# Patient Record
Sex: Male | Born: 1945 | Race: White | Hispanic: No | Marital: Married | Smoking: Never smoker
Health system: Southern US, Community
[De-identification: ages and names within clinical notes are randomized; demographics above are authoritative.]

## PROBLEM LIST (undated history)

## (undated) DIAGNOSIS — R739 Hyperglycemia, unspecified: Secondary | ICD-10-CM

## (undated) DIAGNOSIS — N400 Enlarged prostate without lower urinary tract symptoms: Secondary | ICD-10-CM

## (undated) DIAGNOSIS — N200 Calculus of kidney: Secondary | ICD-10-CM

## (undated) DIAGNOSIS — I4510 Unspecified right bundle-branch block: Secondary | ICD-10-CM

## (undated) DIAGNOSIS — I809 Phlebitis and thrombophlebitis of unspecified site: Secondary | ICD-10-CM

## (undated) DIAGNOSIS — I8390 Asymptomatic varicose veins of unspecified lower extremity: Secondary | ICD-10-CM

## (undated) DIAGNOSIS — E785 Hyperlipidemia, unspecified: Secondary | ICD-10-CM

## (undated) DIAGNOSIS — E119 Type 2 diabetes mellitus without complications: Secondary | ICD-10-CM

## (undated) DIAGNOSIS — I10 Essential (primary) hypertension: Secondary | ICD-10-CM

## (undated) DIAGNOSIS — R0989 Other specified symptoms and signs involving the circulatory and respiratory systems: Secondary | ICD-10-CM

## (undated) DIAGNOSIS — G459 Transient cerebral ischemic attack, unspecified: Secondary | ICD-10-CM

## (undated) HISTORY — PX: NASAL SEPTUM SURGERY: SHX37

## (undated) HISTORY — DX: Calculus of kidney: N20.0

## (undated) HISTORY — DX: Asymptomatic varicose veins of unspecified lower extremity: I83.90

## (undated) HISTORY — DX: Essential (primary) hypertension: I10

## (undated) HISTORY — PX: COLONOSCOPY: SHX174

## (undated) HISTORY — PX: TONSILLECTOMY: SUR1361

## (undated) HISTORY — DX: Benign prostatic hyperplasia without lower urinary tract symptoms: N40.0

## (undated) HISTORY — DX: Hyperglycemia, unspecified: R73.9

## (undated) HISTORY — DX: Unspecified right bundle-branch block: I45.10

## (undated) HISTORY — DX: Gilbert syndrome: E80.4

## (undated) HISTORY — PX: OTHER SURGICAL HISTORY: SHX169

## (undated) HISTORY — DX: Hyperlipidemia, unspecified: E78.5

## (undated) HISTORY — DX: Other specified symptoms and signs involving the circulatory and respiratory systems: R09.89

## (undated) HISTORY — PX: CYSTOSCOPY: SUR368

## (undated) HISTORY — DX: Type 2 diabetes mellitus without complications: E11.9

## (undated) HISTORY — DX: Transient cerebral ischemic attack, unspecified: G45.9

## (undated) HISTORY — DX: Phlebitis and thrombophlebitis of unspecified site: I80.9

---

## 2000-08-15 ENCOUNTER — Other Ambulatory Visit: Admission: RE | Admit: 2000-08-15 | Discharge: 2000-08-15 | Payer: Self-pay | Admitting: Urology

## 2002-11-18 ENCOUNTER — Ambulatory Visit (HOSPITAL_COMMUNITY): Admission: RE | Admit: 2002-11-18 | Discharge: 2002-11-18 | Payer: Self-pay | Admitting: Internal Medicine

## 2002-11-18 ENCOUNTER — Encounter: Payer: Self-pay | Admitting: Internal Medicine

## 2004-09-16 DIAGNOSIS — I809 Phlebitis and thrombophlebitis of unspecified site: Secondary | ICD-10-CM

## 2004-09-16 HISTORY — DX: Phlebitis and thrombophlebitis of unspecified site: I80.9

## 2004-09-29 ENCOUNTER — Inpatient Hospital Stay (HOSPITAL_COMMUNITY): Admission: AD | Admit: 2004-09-29 | Discharge: 2004-10-03 | Payer: Self-pay | Admitting: Internal Medicine

## 2004-09-29 ENCOUNTER — Ambulatory Visit: Payer: Self-pay | Admitting: Internal Medicine

## 2005-02-01 ENCOUNTER — Ambulatory Visit: Payer: Self-pay | Admitting: Internal Medicine

## 2005-02-15 ENCOUNTER — Ambulatory Visit: Payer: Self-pay | Admitting: Internal Medicine

## 2005-02-22 ENCOUNTER — Ambulatory Visit: Payer: Self-pay | Admitting: Internal Medicine

## 2005-04-12 ENCOUNTER — Ambulatory Visit: Payer: Self-pay | Admitting: Internal Medicine

## 2005-05-28 ENCOUNTER — Ambulatory Visit: Payer: Self-pay | Admitting: Internal Medicine

## 2005-06-21 ENCOUNTER — Ambulatory Visit: Payer: Self-pay | Admitting: Internal Medicine

## 2005-09-18 ENCOUNTER — Ambulatory Visit: Payer: Self-pay | Admitting: Internal Medicine

## 2005-10-29 ENCOUNTER — Ambulatory Visit: Payer: Self-pay | Admitting: Internal Medicine

## 2005-11-22 ENCOUNTER — Ambulatory Visit: Payer: Self-pay | Admitting: Internal Medicine

## 2005-12-14 ENCOUNTER — Ambulatory Visit: Payer: Self-pay | Admitting: Internal Medicine

## 2006-03-12 ENCOUNTER — Ambulatory Visit: Payer: Self-pay | Admitting: Internal Medicine

## 2006-03-18 ENCOUNTER — Ambulatory Visit: Payer: Self-pay | Admitting: Internal Medicine

## 2006-08-22 ENCOUNTER — Ambulatory Visit: Payer: Self-pay | Admitting: Internal Medicine

## 2006-08-29 ENCOUNTER — Ambulatory Visit: Payer: Self-pay | Admitting: Internal Medicine

## 2007-02-10 ENCOUNTER — Ambulatory Visit: Payer: Self-pay | Admitting: Internal Medicine

## 2007-02-10 LAB — CONVERTED CEMR LAB
ALT: 19 units/L (ref 0–40)
AST: 21 units/L (ref 0–37)
BUN: 18 mg/dL (ref 6–23)
Cholesterol: 156 mg/dL (ref 0–200)
Creatinine, Ser: 0.9 mg/dL (ref 0.4–1.5)
Creatinine,U: 55.8 mg/dL
HDL: 50.5 mg/dL (ref >39.0)
Hgb A1c MFr Bld: 5.9 % (ref 4.6–6.0)
LDL Cholesterol: 97 mg/dL (ref 0–99)
Microalb Creat Ratio: 3.6 mg/g (ref 0.0–30.0)
Microalb, Ur: 0.2 mg/dL (ref 0.0–1.9)
Potassium: 5 meq/L (ref 3.5–5.1)
Total CHOL/HDL Ratio: 3.1
Triglycerides: 41 mg/dL (ref 0–149)
VLDL: 8 mg/dL (ref 0–40)

## 2007-06-05 ENCOUNTER — Telehealth (INDEPENDENT_AMBULATORY_CARE_PROVIDER_SITE_OTHER): Payer: Self-pay | Admitting: *Deleted

## 2007-07-29 ENCOUNTER — Ambulatory Visit: Payer: Self-pay | Admitting: Internal Medicine

## 2007-08-08 ENCOUNTER — Encounter (INDEPENDENT_AMBULATORY_CARE_PROVIDER_SITE_OTHER): Payer: Self-pay | Admitting: *Deleted

## 2007-08-08 ENCOUNTER — Telehealth (INDEPENDENT_AMBULATORY_CARE_PROVIDER_SITE_OTHER): Payer: Self-pay | Admitting: *Deleted

## 2007-09-16 ENCOUNTER — Encounter: Payer: Self-pay | Admitting: Internal Medicine

## 2008-02-13 ENCOUNTER — Ambulatory Visit: Payer: Self-pay | Admitting: Internal Medicine

## 2008-02-13 DIAGNOSIS — I4510 Unspecified right bundle-branch block: Secondary | ICD-10-CM | POA: Insufficient documentation

## 2008-02-13 DIAGNOSIS — E1169 Type 2 diabetes mellitus with other specified complication: Secondary | ICD-10-CM

## 2008-02-13 DIAGNOSIS — E785 Hyperlipidemia, unspecified: Secondary | ICD-10-CM

## 2008-02-13 DIAGNOSIS — N400 Enlarged prostate without lower urinary tract symptoms: Secondary | ICD-10-CM | POA: Insufficient documentation

## 2008-02-13 DIAGNOSIS — I10 Essential (primary) hypertension: Secondary | ICD-10-CM

## 2008-02-17 ENCOUNTER — Encounter (INDEPENDENT_AMBULATORY_CARE_PROVIDER_SITE_OTHER): Payer: Self-pay | Admitting: *Deleted

## 2008-03-22 ENCOUNTER — Encounter (INDEPENDENT_AMBULATORY_CARE_PROVIDER_SITE_OTHER): Payer: Self-pay | Admitting: *Deleted

## 2008-03-22 ENCOUNTER — Ambulatory Visit: Payer: Self-pay | Admitting: Internal Medicine

## 2008-03-22 LAB — CONVERTED CEMR LAB
OCCULT 1: NEGATIVE
OCCULT 2: NEGATIVE
OCCULT 3: NEGATIVE

## 2008-07-19 ENCOUNTER — Ambulatory Visit: Payer: Self-pay | Admitting: Internal Medicine

## 2008-08-01 LAB — CONVERTED CEMR LAB
Cholesterol: 148 mg/dL (ref 0–200)
HDL: 40 mg/dL (ref >39.0)
LDL Cholesterol: 95 mg/dL (ref 0–99)
Total CHOL/HDL Ratio: 3.7
Triglycerides: 64 mg/dL (ref 0–149)
VLDL: 13 mg/dL (ref 0–40)

## 2008-08-02 ENCOUNTER — Encounter (INDEPENDENT_AMBULATORY_CARE_PROVIDER_SITE_OTHER): Payer: Self-pay | Admitting: *Deleted

## 2008-09-16 ENCOUNTER — Encounter: Payer: Self-pay | Admitting: Internal Medicine

## 2009-01-25 ENCOUNTER — Ambulatory Visit: Payer: Self-pay | Admitting: Internal Medicine

## 2009-01-25 DIAGNOSIS — E1151 Type 2 diabetes mellitus with diabetic peripheral angiopathy without gangrene: Secondary | ICD-10-CM

## 2009-07-25 ENCOUNTER — Ambulatory Visit: Payer: Self-pay | Admitting: Internal Medicine

## 2009-07-25 ENCOUNTER — Encounter (INDEPENDENT_AMBULATORY_CARE_PROVIDER_SITE_OTHER): Payer: Self-pay | Admitting: *Deleted

## 2009-07-25 LAB — CONVERTED CEMR LAB
ALT: 26 units/L (ref 0–53)
AST: 26 units/L (ref 0–37)
Albumin: 4.1 g/dL (ref 3.5–5.2)
Alkaline Phosphatase: 53 units/L (ref 39–117)
BUN: 22 mg/dL (ref 6–23)
Bilirubin, Direct: 0.2 mg/dL (ref 0.0–0.3)
CO2: 31 meq/L (ref 19–32)
Calcium: 9.3 mg/dL (ref 8.4–10.5)
Chloride: 108 meq/L (ref 96–112)
Cholesterol: 183 mg/dL (ref 0–200)
Creatinine, Ser: 0.9 mg/dL (ref 0.4–1.5)
Creatinine,U: 220.6 mg/dL
GFR calc non Af Amer: 89.89 mL/min (ref >60)
Glucose, Bld: 102 mg/dL — ABNORMAL HIGH (ref 70–99)
HDL: 48.4 mg/dL (ref >39.00)
Hgb A1c MFr Bld: 6 % (ref 4.6–6.5)
LDL Cholesterol: 119 mg/dL — ABNORMAL HIGH (ref 0–99)
Microalb Creat Ratio: 1.4 mg/g (ref 0.0–30.0)
Microalb, Ur: 0.3 mg/dL (ref 0.0–1.9)
Potassium: 4.5 meq/L (ref 3.5–5.1)
Sodium: 144 meq/L (ref 135–145)
Total Bilirubin: 1.2 mg/dL (ref 0.3–1.2)
Total CHOL/HDL Ratio: 4
Total Protein: 7 g/dL (ref 6.0–8.3)
Triglycerides: 80 mg/dL (ref 0.0–149.0)
VLDL: 16 mg/dL (ref 0.0–40.0)

## 2009-10-13 ENCOUNTER — Ambulatory Visit: Payer: Self-pay | Admitting: Internal Medicine

## 2010-01-30 ENCOUNTER — Encounter: Payer: Self-pay | Admitting: Internal Medicine

## 2010-04-17 ENCOUNTER — Ambulatory Visit: Payer: Self-pay | Admitting: Internal Medicine

## 2010-04-17 LAB — CONVERTED CEMR LAB
ALT: 19 units/L (ref 0–53)
AST: 19 units/L (ref 0–37)
Albumin: 4.2 g/dL (ref 3.5–5.2)
Alkaline Phosphatase: 43 units/L (ref 39–117)
BUN: 18 mg/dL (ref 6–23)
Bilirubin, Direct: 0.1 mg/dL (ref 0.0–0.3)
Cholesterol: 156 mg/dL (ref 0–200)
Creatinine, Ser: 1 mg/dL (ref 0.4–1.5)
HDL: 49.8 mg/dL (ref >39.00)
LDL Cholesterol: 94 mg/dL (ref 0–99)
Potassium: 4.9 meq/L (ref 3.5–5.1)
Total Bilirubin: 0.7 mg/dL (ref 0.3–1.2)
Total CHOL/HDL Ratio: 3
Total Protein: 6.6 g/dL (ref 6.0–8.3)
Triglycerides: 62 mg/dL (ref 0.0–149.0)
VLDL: 12.4 mg/dL (ref 0.0–40.0)

## 2010-04-26 ENCOUNTER — Encounter: Payer: Self-pay | Admitting: Internal Medicine

## 2010-04-27 ENCOUNTER — Ambulatory Visit: Payer: Self-pay | Admitting: Internal Medicine

## 2010-04-27 DIAGNOSIS — J302 Other seasonal allergic rhinitis: Secondary | ICD-10-CM

## 2010-04-27 DIAGNOSIS — Z87442 Personal history of urinary calculi: Secondary | ICD-10-CM | POA: Insufficient documentation

## 2010-10-10 ENCOUNTER — Ambulatory Visit: Payer: Self-pay | Admitting: Internal Medicine

## 2010-10-27 ENCOUNTER — Encounter: Payer: Self-pay | Admitting: Internal Medicine

## 2010-12-22 ENCOUNTER — Encounter: Payer: Self-pay | Admitting: Internal Medicine

## 2011-01-14 LAB — CONVERTED CEMR LAB
ALT: 22 units/L (ref 0–53)
AST: 20 units/L (ref 0–37)
BUN: 16 mg/dL (ref 6–23)
Cholesterol, target level: 200 mg/dL
Cholesterol: 175 mg/dL (ref 0–200)
Creatinine, Ser: 0.9 mg/dL (ref 0.4–1.5)
Creatinine,U: 132.9 mg/dL
HDL goal, serum: 40 mg/dL
HDL: 46 mg/dL (ref >39.0)
Hgb A1c MFr Bld: 5.7 % (ref 4.6–6.0)
LDL Cholesterol: 114 mg/dL — ABNORMAL HIGH (ref 0–99)
LDL Goal: 130 mg/dL
Microalb Creat Ratio: 4.5 mg/g (ref 0.0–30.0)
Microalb, Ur: 0.6 mg/dL (ref 0.0–1.9)
Potassium: 4.1 meq/L (ref 3.5–5.1)
Total CHOL/HDL Ratio: 3.8
Triglycerides: 77 mg/dL (ref 0–149)
VLDL: 15 mg/dL (ref 0–40)

## 2011-01-18 NOTE — Letter (Signed)
Summary: [facility]   Imported By: Lanelle Bal 05/03/2010 09:46:49  _____________________________________________________________________  External Attachment:    Type:   Image     Comment:   External Document

## 2011-01-18 NOTE — Letter (Signed)
Summary: [facility]  [facility]   Imported By: Lanelle Bal 11/08/2010 13:49:55  _____________________________________________________________________  External Attachment:    Type:   Image     Comment:   External Document

## 2011-01-18 NOTE — Assessment & Plan Note (Signed)
Summary: COUGH AND COLD//PH   Vital Signs:  Patient profile:   65 year old male Weight:      184.8 pounds BMI:     26.42 Temp:     98.3 degrees F 0 Pulse rate:   88 / minute Resp:     16 per minute BP sitting:   138 / 90  (left arm) Cuff size:   large  Vitals Entered By: Shonna Chock CMA (October 10, 2010 10:31 AM) CC: Cold and cough , Cough   CC:  Cold and cough  and Cough.  History of Present Illness: Cough      This is a 65 year old man who presents with Cough X 10 days.  The patient reports productive cough with yellow sputum , but denies pleuritic chest pain, shortness of breath, wheezing, exertional dyspnea, fever, hemoptysis, and malaise.  Associated symtpoms include sore throat from the cough.  The patient denies the following symptoms: cold/URI symptoms and nasal congestion.  The cough is worse with cold exposure.  Ineffective prior treatments have included OTC cough medication.  Rx: Mucinex, Zinc drops, "rock & rye", Robitussin with marginal response.  Current Medications (verified): 1)  Benazepril Hcl 40 Mg  Tabs (Benazepril Hcl) .Marland Kitchen.. 1 By Mouth Qd 2)  Baby Asa 3)  Vit E 4)  Freestyle Lite Test  Strp (Glucose Blood) 5)  Freestyle Lancets  Misc (Lancets) 6)  Fish Oil 1000 Mg Caps (Omega-3 Fatty Acids) .Marland Kitchen.. 1 By Mouth Once Daily 7)  Simvastatin 40 Mg Tabs (Simvastatin) .Marland Kitchen.. 1 At Bedtime 8)  Mucinex Dm 30-600 Mg Xr12h-Tab (Dextromethorphan-Guaifenesin) .... As Needed  Allergies (verified): No Known Drug Allergies  Physical Exam  General:  well-nourished,in no acute distress; alert,appropriate and cooperative throughout examination Ears:  External ear exam shows no significant lesions or deformities.  Otoscopic examination reveals clear canals, tympanic membranes are intact bilaterally without bulging, retraction, inflammation or discharge. Hearing is grossly normal bilaterally. R TM dull Nose:  External nasal examination shows no deformity or inflammation. Nasal  mucosa are pink and moist without lesions or exudates. Mouth:  Oral mucosa and oropharynx without lesions or exudates.  Teeth in good repair. Hoarse Lungs:  Normal respiratory effort, chest expands symmetrically. Lungs are clear to auscultation, no crackles or wheezes.Dry cough Heart:  Normal rate and regular rhythm. S1 and S2 normal without gallop, murmur, click, rub .S4  Cervical Nodes:  No lymphadenopathy noted Axillary Nodes:  No palpable lymphadenopathy   Impression & Recommendations:  Problem # 1:  BRONCHITIS-ACUTE (ICD-466.0)  His updated medication list for this problem includes:    Mucinex Dm 30-600 Mg Xr12h-tab (Dextromethorphan-guaifenesin) .Marland Kitchen... As needed    Azithromycin 250 Mg Tabs (Azithromycin) .Marland Kitchen... As per pack    Hydromet 5-1.5 Mg/24ml Syrp (Hydrocodone-homatropine) .Marland Kitchen... 1 tsp every 6 hrs as needed  Complete Medication List: 1)  Benazepril Hcl 40 Mg Tabs (Benazepril hcl) .Marland Kitchen.. 1 by mouth qd 2)  Baby Asa  3)  Vit E  4)  Freestyle Lite Test Strp (Glucose blood) 5)  Freestyle Lancets Misc (Lancets) 6)  Fish Oil 1000 Mg Caps (Omega-3 fatty acids) .Marland Kitchen.. 1 by mouth once daily 7)  Simvastatin 40 Mg Tabs (Simvastatin) .Marland Kitchen.. 1 at bedtime 8)  Mucinex Dm 30-600 Mg Xr12h-tab (Dextromethorphan-guaifenesin) .... As needed 9)  Azithromycin 250 Mg Tabs (Azithromycin) .... As per pack 10)  Hydromet 5-1.5 Mg/13ml Syrp (Hydrocodone-homatropine) .Marland Kitchen.. 1 tsp every 6 hrs as needed  Patient Instructions: 1)  Drink as much  NON dairy  fluid as you can tolerate for the next few days. Prescriptions: HYDROMET 5-1.5 MG/5ML SYRP (HYDROCODONE-HOMATROPINE) 1 tsp every 6 hrs as needed  #120cc x 0   Entered and Authorized by:   Marga Melnick MD   Signed by:   Marga Melnick MD on 10/10/2010   Method used:   Print then Give to Patient   RxID:   [id number] AZITHROMYCIN 250 MG TABS (AZITHROMYCIN) as per pack  #1 x 0   Entered and Authorized by:   Marga Melnick MD   Signed by:   Marga Melnick  MD on 10/10/2010   Method used:   Print then Give to Patient   RxID:   [id number]    Orders Added: 1)  Est. Patient Level III [84696]

## 2011-01-18 NOTE — Consult Note (Signed)
Summary: [facility]   Imported By: Lanelle Bal 01/04/2011 11:56:01  _____________________________________________________________________  External Attachment:    Type:   Image     Comment:   External Document

## 2011-01-18 NOTE — Letter (Signed)
Summary: [facility]  [facility]   Imported By: Lanelle Bal 02/02/2010 13:54:21  _____________________________________________________________________  External Attachment:    Type:   Image     Comment:   External Document

## 2011-01-18 NOTE — Assessment & Plan Note (Signed)
Summary: FOR MR5--PH   Vital Signs:  Patient profile:   65 year old male Pulse rate:   80 / minute Resp:     15 per minute BP sitting:   122 / 80  History of Present Illness: Christopher Bradshaw is here for a med refill & Medicare Wellness Exam; he is having active seasonal  allergic rhinocojunctivitis symptoms. Rx: OTC Claritin with temporary benefit.Risk factors addressed; see ROS.He is unsure as to tetanus or pneumonia vaccine status. FBS 112-120; no hypoglycemia. Weight stable. No retinopathy as per DR Lahoma Crocker 01/30/2010.   Allergies: No Known Drug Allergies  Past History:  Past Medical History: Hyperlipidemia Hypertension femoral bruits varicose veins right lower extremity Benign prostatic hypertrophy,Dr Kimbrough RBBB Hyperglycemia Allergic rhinitis Nephrolithiasis, hx of  Past Surgical History: recurrent renal calculi,lithotripsy X 1, residual L renal calculus , spontaneous passage 10-12 X cystoscopy for  hematuria, urethritis 2004, Dr Aldean Ast thrombophlebitis 09/2004 colonoscopy negative  2000 & 2007 (due 2017) Tonsillectomy  Family History: father lung cancer, Alzheimers died @  27 Mom - 20,  A &W paternal aunt: cancer colon?; P aunt: ovarian cancer paternal grandfather :emphysema, colon cancer  Social History: Never Smoked Married Alcohol use-yes: socially Occupation:[employer], part time  Regular exercise-yes:walks once daily 60  min w/o symptoms  Review of Systems General:  Denies chills, fatigue, fever, loss of appetite, sweats, and weight loss. Eyes:  Denies blurring, double vision, and vision loss-both eyes. ENT:  Complains of nasal congestion and sinus pressure; denies difficulty swallowing and hoarseness. CV:  Denies chest pain or discomfort, leg cramps with exertion, lightheadness, near fainting, shortness of breath with exertion, swelling of feet, and swelling of hands. Resp:  Denies cough and sputum productive. GI:  Denies abdominal pain,  bloody stools, dark tarry stools, and indigestion. GU:  Denies discharge, dysuria, and hematuria; PSA was 1.59 & DRE WNL 04/21/2010. MS:  Denies joint pain, low back pain, mid back pain, and thoracic pain. Derm:  Denies changes in nail beds, dryness, hair loss, lesion(s), and poor wound healing. Neuro:  Denies brief paralysis, difficulty with concentration, disturbances in coordination, memory loss, numbness, poor balance, tingling, and weakness. Psych:  Denies anxiety, depression, easily angered, easily tearful, and irritability. Endo:  Denies cold intolerance, excessive hunger, excessive thirst, excessive urination, and heat intolerance. Heme:  Denies abnormal bruising and bleeding. Allergy:  Complains of itching eyes, seasonal allergies, and sneezing; denies hives or rash; No angioedema.  Physical Exam  General:  well-nourished,in no acute distress; alert,appropriate and cooperative throughout examination Head:  Normocephalic and atraumatic without obvious abnormalities. No apparent alopecia  Eyes:  No corneal or conjunctival inflammation noted. EOMI. Perrla. Funduscopic exam benign, without hemorrhages, exudates or papilledema. Vision grossly normal. Ears:  External ear exam shows no significant lesions or deformities.  Otoscopic examination reveals clear canals, tympanic membranes are intact bilaterally without bulging, retraction, inflammation or discharge. Hearing is grossly normal bilaterally. Nose:  External nasal examination shows no deformity or inflammation. Nasal mucosa are pink and moist without lesions or exudates. Septum to R; obvious nasal congestion Mouth:  Oral mucosa and oropharynx without lesions or exudates.  Teeth in good repair. Neck:  No deformities, masses, or tenderness noted. Lungs:  Normal respiratory effort, chest expands symmetrically. Lungs are clear to auscultation, no crackles or wheezes. Heart:  Normal rate and regular rhythm. S1  normal ; S2 split physiologic  without gallop, murmur, click, rub or other extra sounds. Abdomen:  Bowel sounds positive,abdomen soft and non-tender without masses, organomegaly or  hernias noted. Genitalia:  Dr Aldean Ast Msk:  No deformity or scoliosis noted of thoracic or lumbar spine.   Pulses:  R and L carotid,radial,dorsalis pedis and posterior tibial pulses are full and equal bilaterally Extremities:  No clubbing, cyanosis, edema, or deformity noted with normal full range of motion of all joints.  Insignificant DIP changes . Good nail health Neurologic:  alert & oriented X3, strength normal in all extremities, sensation intact to light touch, gait normal, and DTRs symmetrical and normal.   Skin:  Intact without suspicious lesions or rashes Cervical Nodes:  No lymphadenopathy noted Axillary Nodes:  No palpable lymphadenopathy Psych:  Oriented X3, memory intact for recent and remote, normally interactive, good eye contact, not anxious appearing, and not depressed appearing.     Impression & Recommendations:  Problem # 1:  PREVENTIVE HEALTH CARE (ICD-V70.0)  Orders: EKG w/ Interpretation (93000)  Problem # 2:  ALLERGIC RHINITIS (ICD-477.9)  Problem # 3:  FASTING HYPERGLYCEMIA (ICD-790.29)  Problem # 4:  HYPERTENSION, ESSENTIAL NOS (ICD-401.9)  controlled His updated medication list for this problem includes:    Benazepril Hcl 40 Mg Tabs (Benazepril hcl) .Marland Kitchen... 1 by mouth qd  Orders: EKG w/ Interpretation (93000)  Problem # 5:  HYPERLIPIDEMIA (ICD-272.2)  His updated medication list for this problem includes:    Simvastatin 40 Mg Tabs (Simvastatin) .Marland Kitchen... 1 at bedtime  Problem # 6:  BENIGN PROSTATIC HYPERTROPHY (ICD-600.00) as per Dr Aldean Ast  Problem # 7:  RIGHT BUNDLE BRANCH BLOCK (ICD-426.4)  Orders: EKG w/ Interpretation (93000)  Complete Medication List: 1)  Benazepril Hcl 40 Mg Tabs (Benazepril hcl) .Marland Kitchen.. 1 by mouth qd 2)  Baby Asa  3)  Vit E  4)  Freestyle Lite Test Strp (Glucose blood) 5)   Freestyle Lancets Misc (Lancets) 6)  Fish Oil 1000 Mg Caps (Omega-3 fatty acids) .Marland Kitchen.. 1 by mouth once daily 7)  Simvastatin 40 Mg Tabs (Simvastatin) .Marland Kitchen.. 1 at bedtime  Patient Instructions: 1)  It is important that you exercise regularly at least 20 minutes 5 times a week. If you develop chest pain, have severe difficulty breathing, or feel very tired , stop exercising immediately and seek medical attention.Take an Aspirin every day.Choose your Health care Power of Attorney and/or prepare a Living Will.Check your blood sugars regularly. If your readings are usually above : 150 or below 90 you should contact our office.See your eye doctor yearly to check for diabetic eye damage.Check your feet each night for sore areas, calluses or signs of infection.Check your Blood Pressure regularly. If it is above: 135/85 ON AVERAGE you should make an appointment. Assess response to Singulair 10 mg once daily & Neti Rinse once daily for allergic symptoms.It is important that your Diabetic A1c level is checked every 12  months. Prescriptions: SIMVASTATIN 40 MG TABS (SIMVASTATIN) 1 at bedtime  #90 x 3   Entered and Authorized by:   Marga Melnick MD   Signed by:   Marga Melnick MD on 04/27/2010   Method used:   Print then Give to Patient   RxID:   [id number] BENAZEPRIL HCL 40 MG  TABS (BENAZEPRIL HCL) 1 by mouth qd  #90 x 3   Entered and Authorized by:   Marga Melnick MD   Signed by:   Marga Melnick MD on 04/27/2010   Method used:   Print then Give to Patient   RxID:   [id number]

## 2011-05-04 NOTE — Discharge Summary (Signed)
NAMESHAMMOND, ARAVE                 ACCOUNT NO.:  [id number]   MEDICAL RECORD NO.:  [id number]          PATIENT TYPE:  INP   LOCATION:  0471                         [facility]ACILITY[facility]  [facility]   PHYSICIAN:  Rene Paci, M.D. LHCDATE OF BIRTH:  Apr 30, 1946   DATE OF ADMISSION:  09/29/2004  DATE OF DISCHARGE:  10/03/2004                                 DISCHARGE SUMMARY   DISCHARGE DIAGNOSES:  1.  Right lower extremity thrombophlebitis with completely thrombosed      greater saphenous vein.  Negative deep venous thrombosis by lower      extremity Doppler.  Improved with p.o. antibiotics.  Outpatient follow-      up with vein specialist.  See below.  2.  History of hypertension.  3.  History of hypercholesterolemia.  4.  History of peripheral vascular disease.   DISCHARGE MEDICATIONS:  1.  Avelox 400 mg p.o. daily x10 additional days to complete two week      course.  2.  Maxzide 75/50 one p.o. daily.  3.  Aspirin 81 mg p.o. daily.  4.  Lipitor 20 mg p.o. q.h.s.  5.  Multivitamin one p.o. daily.   FOLLOWUP:  [facility], Dr. Molli Hazard at telephone number  [phone].  Scheduled for Wednesday, October 26 at 9:30 a.m., though their  office will contact patient if availability opens prior to this date.  Patient will continue p.o. antibiotics until this time.   CONDITION ON DISCHARGE:  Medically stable and improved.  Hospital follow-up  as listed above and with primary care physician, Dr. Marga Melnick, as  needed.   HOSPITAL COURSE:  #1 - LOWER EXTREMITY THROMBOPHLEBITIS:  Patient is a  pleasant 65 year old gentleman who has experienced increased swelling, pain,  and redness in his right lower extremity over a week prior to admission.  He  presented to his primary care physician for further evaluation and was  admitted to the hospital for rule out a DVT.  Because of scheduling  conflicts, Doppler was not performed until October 17.  This showed no deep  venous  thrombosis, but a completely thrombosed greater saphenous vein on the  right side.  Patient's subsequent thrombophlebitis with surrounding  cellulitis improved over this time initially with nafcillin, then ampicillin  changed to p.o. Avelox all of which he has tolerated well with clinical  improvement.  He has been afebrile.  At this time he was stable for  discharge to continue p.o. antibiotics and outpatient follow-up for his  thrombosis and thrombophlebitis with [facility] as described  above.   #2 - OTHER MEDICAL ISSUES:  Patient's hypertension and hyperlipidemia were  controlled with his home medications and were not of significant medical  importance during this admission.    [place]  VL/MEDQ  D:  10/03/2004  T:  10/03/2004  Job:  [id number]

## 2011-05-04 NOTE — H&P (Signed)
Christopher Bradshaw, Christopher Bradshaw                 ACCOUNT NO.:  [id number]   MEDICAL RECORD NO.:  [id number]          PATIENT TYPE:  INP   LOCATION:  0471                         FACILITY:  [facility]   PHYSICIAN:  Titus Dubin. Alwyn Ren, M.D. [facility] OF BIRTH:  1946-02-14   DATE OF ADMISSION:  09/29/2004  DATE OF DISCHARGE:                                HISTORY & PHYSICAL   HISTORY OF PRESENT ILLNESS:  Christopher Bradshaw is a 65 year old white male  admitted with thrombophlebitis with cellulitis in the right lower extremity.   Over the weekend of October 7/September 23, 2004, he was inactive and  essentially house bound because of the rain.  As of September 26, 2004, he  noted some redness in the right lower extremity with progression more  markedly September 28, 2004.  Although he had no pain while walking, he would  have pain simply sitting.   He denies any chest pain or shortness of breath.   He states that approximately 40 years ago he was checked for clots in the  leg with apparent ultrasound, which was negative.   PAST SURGICAL HISTORY:  1.  Renal calculi, recurrent since 1995.  He has had lithotripsy, and is      followed by Dr. Vic Blackbird.  2.  He has also had tonsillectomy.  3.  Septoplasty.  4.  Colonoscopy in 2000 was negative.  5.  He has had cystoscopy for hematuria with complications of urethritis.   MEDICAL PROBLEMS:  1.  Hypertension.  2.  Hyperlipidemia.  3.  Peripheral vascular disease with femoral bruits and varicose veins in      the right lower extremity.  4.  He has also had prostatic hypertrophy.  5.  Stones have been calcium oxalate in nature.   FAMILY HISTORY:  Lung cancer, Alzheimer's, and renal calculi in his father.  Paternal aunt had cancer, possibly of the colon.  Paternal grandfather had  emphysema and colon cancer.   SOCIAL HISTORY:  He has never smoked.  He does not drink.   ALLERGIES:  He had palpitations while on ZOCOR.   CURRENT MEDICATIONS:  1.  Multivitamin  supplements.  2.  Baby aspirin.  3.  Triamterene/hydrochlorothiazide 75/50 one daily.  4.  Lipitor 20.   REVIEW OF SYSTEMS:  Otherwise negative with no GI or genitourinary  symptomatology.   PHYSICAL EXAMINATION:  VITAL SIGNS:  Weight is 192, temperature 98, pulse  64, respiratory rate 18, blood pressure 132/98.  HEENT:  Fundal exam reveals some arteriolar narrowing.  Otolaryngologic exam  is unremarkable.  LYMPHATIC:  He has no lymphadenopathy about the head, neck, or axilla.  NECK:  Thyroid is slightly irregular in contour, but there are no definite  nodules.  __________ with slurring.  CHEST:  Clear with no increased work of breathing or splinting.  ABDOMEN:  He has no organomegaly or masses, and there was no  lymphadenopathy.  EXTREMITIES:  There was erythema diffusely over the right medial calf with  large varicose veins which were tender to palpation.  Homans sign was  negative bilaterally.  GENITOURINARY:  Initially deferred.  He does have a card mailed from Dr.  Valda Lamb office stating that his PSA was 1.13 on a recent check.   PLAN:  He will be admitted with Lovenox coverage and ultrasound evaluation.  He will be placed on nafcillin IV because of the thrombophlebitis with high  risk for deep vein thrombosis.      [facility]/MEDQ  D:  09/29/2004  T:  09/29/2004  Job:  [id number]

## 2011-05-31 ENCOUNTER — Other Ambulatory Visit: Payer: Self-pay | Admitting: Internal Medicine

## 2011-07-19 ENCOUNTER — Encounter: Payer: Self-pay | Admitting: Internal Medicine

## 2011-07-20 ENCOUNTER — Ambulatory Visit (INDEPENDENT_AMBULATORY_CARE_PROVIDER_SITE_OTHER): Payer: Medicare Other | Admitting: Internal Medicine

## 2011-07-20 ENCOUNTER — Encounter: Payer: Self-pay | Admitting: Internal Medicine

## 2011-07-20 DIAGNOSIS — Z23 Encounter for immunization: Secondary | ICD-10-CM

## 2011-07-20 DIAGNOSIS — R7309 Other abnormal glucose: Secondary | ICD-10-CM

## 2011-07-20 DIAGNOSIS — E782 Mixed hyperlipidemia: Secondary | ICD-10-CM

## 2011-07-20 DIAGNOSIS — I10 Essential (primary) hypertension: Secondary | ICD-10-CM

## 2011-07-20 DIAGNOSIS — Z0000 Encounter for general adult medical examination without abnormal findings: Secondary | ICD-10-CM

## 2011-07-20 DIAGNOSIS — N400 Enlarged prostate without lower urinary tract symptoms: Secondary | ICD-10-CM

## 2011-07-20 NOTE — Progress Notes (Signed)
Subjective:    Patient ID: Christopher Bradshaw, male    DOB: [birth year], 65 y.o.   MRN: [id number]  HPI Medicare Wellness Visit:  The following psychosocial & medical history were reviewed as required by Medicare.   Social history: caffeine: 2 cups / day , alcohol:  Rare beer ,  tobacco use : never  & exercise : walking daily 30-60 minutes.   Home & personal  safety / fall risk: no issues, activities of daily living: no limitations , seatbelt use : yes , and smoke alarm employment : yes .  Power of Attorney/Living Will status : needed  Vision ( as recorded per Nurse) & Hearing  evaluation :  Ophth exam 03/30/11 , no retinopathy but early cataract, Dr Nile Riggs. Orientation :oriented X3 , memory & recall :good, spelling  testing: good,and mood & affect : normal . Depression / anxiety: no Travel history : 37 [place] [employer]) , immunization status :Pneumovax needed , transfusion history:  no, and preventive health surveillance ( colonoscopies, BMD , etc as per protocol/ [facility]): colonoscopy up to date;PSA 05/11 was 1.59, DRE 11/11 Dr Kimbrough,F/U 11/12. Dental care:  Seen every 6 months. Chart reviewed &  Updated. Active issues reviewed & addressed.        Review of Systems  #1HYPERTENSION: Disease Monitoring: Blood pressure range-averages 130/70 @ home  Chest pain, palpitations, claudication- no       Dyspnea, PND- no Medications: Compliance- yes  Lightheadedness,Syncope- no    Edema- no  #2 DIABETES: Disease Monitoring: Blood Sugar ranges-FBS 115-120  Polyuria/phagia/dipsia- no       Visual problems- no (see Ophth above) Medications: Compliance- on no meds  Hypoglycemic symptoms- no  #3 HYPERLIPIDEMIA: Disease Monitoring: See symptoms for Hypertension Medications: Compliance- yes  Abd pain, bowel changes- no   Muscle aches- no   ROS: He was treated for tenosynovitis of elbows by Dr. Alfredo Bach. He has a sleeve (bilaterally)  he wears and he performs exercises.       Objective:   Physical Exam Gen.: Thin but healthy and well-nourished in appearance. Alert, appropriate and cooperative throughout exam. Head: Normocephalic without obvious abnormalities;  no alopecia  Eyes: No corneal or conjunctival inflammation noted. Pupils equal round reactive to light and accommodation. Slight ptosis. Extraocular motion intact.  Ears: External  ear exam reveals no significant lesions or deformities. Canals clear .TMs normal. Hearing is grossly normal bilaterally. Nose: External nasal exam reveals no deformity or inflammation. Nasal mucosa are pink and moist. No lesions or exudates noted. Septum  normal  Mouth: Oral mucosa and oropharynx reveal no lesions or exudates. Teeth in good repair. Neck: No deformities, masses, or tenderness noted. Range of motion &. Thyroid normal. Lungs: Normal respiratory effort; chest expands symmetrically. Lungs are clear to auscultation without rales, wheezes, or increased work of breathing. Heart: Normal rate and rhythm. Normal S1 and S2. No gallop, click, or rub. Soft LSB diastolic murmur. Abdomen: Bowel sounds normal; abdomen soft and nontender. No masses, organomegaly or hernias noted. Genitalia: Dr Aldean Ast   .  Musculoskeletal/extremities: No deformity or scoliosis noted of  the thoracic or lumbar spine. No clubbing, cyanosis, edema, or deformity noted. Range of motion  normal .Tone & strength  normal.Joints normal. Nail health  Good. Pes planus Vascular: Carotid, radial artery, dorsalis pedis and  posterior tibial pulses are full and equal. No bruits present. Neurologic: Alert and oriented x3. Deep tendon reflexes symmetrical and normal.Light touch over feet normal.          Skin: Intact without suspicious lesions or rashes. Lymph: No cervical, axillary  lymphadenopathy present. Psych: Mood and affect are normal. Normally interactive                                                                                          Assessment & Plan:  #1 Medicare Wellness Exam; criteria met ; data entered #2 Problem List reviewed ; Assessment/ Recommendations made Plan: see Orders

## 2011-07-20 NOTE — Patient Instructions (Addendum)
Eat a low-fat diet with lots of fruits and vegetables, up to 7-9 servings per day. Consume less than 40 grams of sugar per day from foods & drinks with High Fructose Corn Sugar as #1,2,3 or # 4 on label. Follow the low carb nutrition program in [employer] as closely as possible to prevent Diabetes progression & complications. White carbohydrates (potatoes, rice, bread, and pasta) have a high spike of sugar and a high load of sugar. For example a  baked potato has a cup of sugar and a  french fry  2 teaspoons of sugar. Yams, wild  rice, whole grained bread &  wheat pasta have been much lower spike and load of  sugar. Portions should be the size of a deck of cards or your palm.  Exercise at least 30-45 minutes a day,  3-4 days a week.  Health Care Power of Attorney & Living Will. Complete if not in place ; these place you in charge of your health care decisions. Please  schedule fasting Labs : BMET,Lipids, hepatic panel, CBC & dif, TSH (see Diagnoses for Codes)

## 2011-07-23 ENCOUNTER — Other Ambulatory Visit (INDEPENDENT_AMBULATORY_CARE_PROVIDER_SITE_OTHER): Payer: Medicare Other

## 2011-07-23 DIAGNOSIS — I10 Essential (primary) hypertension: Secondary | ICD-10-CM

## 2011-07-23 DIAGNOSIS — Z0000 Encounter for general adult medical examination without abnormal findings: Secondary | ICD-10-CM

## 2011-07-23 DIAGNOSIS — R7309 Other abnormal glucose: Secondary | ICD-10-CM

## 2011-07-23 DIAGNOSIS — E782 Mixed hyperlipidemia: Secondary | ICD-10-CM

## 2011-07-23 DIAGNOSIS — N400 Enlarged prostate without lower urinary tract symptoms: Secondary | ICD-10-CM

## 2011-07-23 LAB — BASIC METABOLIC PANEL
BUN: 19 mg/dL (ref 6–23)
Chloride: 103 mEq/L (ref 96–112)
Glucose, Bld: 116 mg/dL — ABNORMAL HIGH (ref 70–99)
Potassium: 4.9 mEq/L (ref 3.5–5.1)
Sodium: 140 mEq/L (ref 135–145)

## 2011-07-23 LAB — CBC WITH DIFFERENTIAL/PLATELET
Basophils Relative: 0.5 % (ref 0.0–3.0)
Eosinophils Absolute: 0.4 10*3/uL (ref 0.0–0.7)
Eosinophils Relative: 6.3 % — ABNORMAL HIGH (ref 0.0–5.0)
Hemoglobin: 14.9 g/dL (ref 13.0–17.0)
Lymphocytes Relative: 25.3 % (ref 12.0–46.0)
MCHC: 33.2 g/dL (ref 30.0–36.0)
Neutro Abs: 3.9 10*3/uL (ref 1.4–7.7)
Neutrophils Relative %: 60.2 % (ref 43.0–77.0)
RBC: 4.78 Mil/uL (ref 4.22–5.81)
WBC: 6.5 10*3/uL (ref 4.5–10.5)

## 2011-07-23 LAB — HEPATIC FUNCTION PANEL
ALT: 20 U/L (ref 0–53)
Bilirubin, Direct: 0.2 mg/dL (ref 0.0–0.3)
Total Bilirubin: 0.9 mg/dL (ref 0.3–1.2)
Total Protein: 6.8 g/dL (ref 6.0–8.3)

## 2011-07-23 LAB — LIPID PANEL
Cholesterol: 155 mg/dL (ref 0–200)
HDL: 50 mg/dL (ref >39.00)
LDL Cholesterol: 94 mg/dL (ref 0–99)
VLDL: 11.2 mg/dL (ref 0.0–40.0)

## 2011-09-03 ENCOUNTER — Other Ambulatory Visit: Payer: Self-pay | Admitting: Internal Medicine

## 2011-10-10 ENCOUNTER — Other Ambulatory Visit: Payer: Self-pay | Admitting: Internal Medicine

## 2011-12-19 DIAGNOSIS — N400 Enlarged prostate without lower urinary tract symptoms: Secondary | ICD-10-CM | POA: Diagnosis not present

## 2011-12-19 DIAGNOSIS — N529 Male erectile dysfunction, unspecified: Secondary | ICD-10-CM | POA: Diagnosis not present

## 2012-02-13 ENCOUNTER — Ambulatory Visit (INDEPENDENT_AMBULATORY_CARE_PROVIDER_SITE_OTHER): Payer: Medicare Other | Admitting: Internal Medicine

## 2012-02-13 VITALS — BP 120/80 | HR 75 | Temp 98.2°F | Wt 191.0 lb

## 2012-02-13 DIAGNOSIS — J209 Acute bronchitis, unspecified: Secondary | ICD-10-CM | POA: Diagnosis not present

## 2012-02-13 DIAGNOSIS — J309 Allergic rhinitis, unspecified: Secondary | ICD-10-CM

## 2012-02-13 MED ORDER — HYDROCODONE-HOMATROPINE 5-1.5 MG/5ML PO SYRP: 5.0000 mL | ORAL_SOLUTION | Freq: Four times a day (QID) | ORAL | Status: AC | PRN

## 2012-02-13 MED ORDER — MONTELUKAST SODIUM 10 MG PO TABS: 10.0000 mg | ORAL_TABLET | Freq: Every day | ORAL | Status: DC

## 2012-02-13 MED ORDER — AZITHROMYCIN 250 MG PO TABS: ORAL_TABLET | ORAL | Status: AC

## 2012-02-13 NOTE — Progress Notes (Signed)
  Subjective:    Patient ID: Christopher Bradshaw, male    DOB: 06-01-1946, 66 y.o.   MRN: [id number]  HPI Respiratory tract infection Onset/symptoms:02/08/12 as sneezing & dry cough Exposures (illness/environmental/extrinsic):no except to elements Progression of symptoms:to productive cough Treatments/response:rest, Cold Ease, Mucinex with some benefit Present symptoms: Fever/chills/sweats:no Frontal headache:R frontal with coughing Facial pain:no Nasal purulence:no Sore throat:no Dental pain:no Lymphadenopathy:no Wheezing/shortness of breath:no Cough/sputum/hemoptysis:clear Pleuritic pain:no Associated extrinsic/allergic symptoms:itchy eyes/ sneezing:no Past medical history: Seasonal allergies: yes/asthma:no Smoking history:never           Review of Systems From mid April to mid June he has sneezing, rhinitis, extrinsic  conjunctivitis and cough on an annual basis from tree pollen. These symptoms responded dramatically to Singulair samples last spring.     Objective:   Physical Exam General appearance:good health ;well nourished; no acute distress or increased work of breathing is present.  No  lymphadenopathy about the head, neck, or axilla noted.   Eyes: No conjunctival inflammation or lid edema is present.   Ears:  External ear exam shows no significant lesions or deformities.  Otoscopic examination reveals clear canals, tympanic membranes are intact bilaterally without bulging, retraction, inflammation or discharge.  Nose:  External nasal examination shows no deformity or inflammation. Nasal mucosa are dry without lesions or exudates. No septal dislocation or deviation.No obstruction to airflow.   Oral exam: Dental hygiene is good; lips and gums are healthy appearing.There is no oropharyngeal erythema or exudate noted.     Heart:  Normal rate and regular rhythm. S1 and S2 normal without gallop, murmur, click, rub or other extra sounds.   Lungs:Chest clear to auscultation;  no wheezes, rhonchi,rales ,or rubs present.No increased work of breathing.    Extremities:  No cyanosis, edema, or clubbing  noted    Skin: Warm & dry           Assessment & Plan:  #1 bronchitis, acute  Plan: See orders and recommendations

## 2012-02-13 NOTE — Telephone Encounter (Signed)
error 

## 2012-02-13 NOTE — Patient Instructions (Signed)
Plain Mucinex for thick secretions ;force NON dairy fluids . Use a Neti pot daily as needed for sinus congestion. Nasal cleansing in the shower as discussed. Make sure that all residual soap is removed to prevent irritation.  

## 2012-04-24 DIAGNOSIS — D3130 Benign neoplasm of unspecified choroid: Secondary | ICD-10-CM | POA: Diagnosis not present

## 2012-04-24 DIAGNOSIS — E119 Type 2 diabetes mellitus without complications: Secondary | ICD-10-CM | POA: Diagnosis not present

## 2012-04-24 DIAGNOSIS — H2510 Age-related nuclear cataract, unspecified eye: Secondary | ICD-10-CM | POA: Diagnosis not present

## 2012-05-02 ENCOUNTER — Encounter: Payer: Self-pay | Admitting: Internal Medicine

## 2012-07-09 ENCOUNTER — Other Ambulatory Visit: Payer: Self-pay | Admitting: Internal Medicine

## 2012-09-18 ENCOUNTER — Encounter: Payer: Self-pay | Admitting: Internal Medicine

## 2012-09-18 ENCOUNTER — Ambulatory Visit (INDEPENDENT_AMBULATORY_CARE_PROVIDER_SITE_OTHER): Payer: Medicare Other | Admitting: Internal Medicine

## 2012-09-18 VITALS — BP 132/88 | HR 71 | Temp 97.7°F | Resp 12 | Ht 71.0 in | Wt 187.4 lb

## 2012-09-18 DIAGNOSIS — Z23 Encounter for immunization: Secondary | ICD-10-CM

## 2012-09-18 DIAGNOSIS — E782 Mixed hyperlipidemia: Secondary | ICD-10-CM

## 2012-09-18 DIAGNOSIS — Z87442 Personal history of urinary calculi: Secondary | ICD-10-CM

## 2012-09-18 DIAGNOSIS — Z0000 Encounter for general adult medical examination without abnormal findings: Secondary | ICD-10-CM | POA: Diagnosis not present

## 2012-09-18 DIAGNOSIS — I10 Essential (primary) hypertension: Secondary | ICD-10-CM | POA: Diagnosis not present

## 2012-09-18 DIAGNOSIS — R7309 Other abnormal glucose: Secondary | ICD-10-CM | POA: Diagnosis not present

## 2012-09-18 LAB — BASIC METABOLIC PANEL
BUN: 20 mg/dL (ref 6–23)
Calcium: 9 mg/dL (ref 8.4–10.5)
GFR: 99.13 mL/min (ref >60.00)
Glucose, Bld: 115 mg/dL — ABNORMAL HIGH (ref 70–99)
Potassium: 3.6 mEq/L (ref 3.5–5.1)

## 2012-09-18 LAB — LIPID PANEL
Cholesterol: 173 mg/dL (ref 0–200)
HDL: 54.3 mg/dL (ref >39.00)
Triglycerides: 81 mg/dL (ref 0.0–149.0)
VLDL: 16.2 mg/dL (ref 0.0–40.0)

## 2012-09-18 LAB — HEPATIC FUNCTION PANEL
AST: 24 U/L (ref 0–37)
Albumin: 4.2 g/dL (ref 3.5–5.2)
Total Bilirubin: 1.1 mg/dL (ref 0.3–1.2)

## 2012-09-18 LAB — HEMOGLOBIN A1C: Hgb A1c MFr Bld: 6.8 % — ABNORMAL HIGH (ref 4.6–6.5)

## 2012-09-18 MED ORDER — BENAZEPRIL HCL 40 MG PO TABS: 40.0000 mg | ORAL_TABLET | Freq: Every day | ORAL | Status: DC

## 2012-09-18 MED ORDER — SIMVASTATIN 40 MG PO TABS: 40.0000 mg | ORAL_TABLET | Freq: Every day | ORAL | Status: DC

## 2012-09-18 NOTE — Progress Notes (Signed)
Subjective:    Patient ID: Christopher Bradshaw, male    DOB: [birth year], 66 y.o.   MRN: [id number]  HPI Medicare Wellness Visit:  The following psychosocial & medical history were reviewed as required by Medicare.   Social history: caffeine: 1 cup coffee/ day , alcohol:  Very rarely ,  tobacco use : never  & exercise : walking daily 45-60 min.   Home & personal  safety / fall risk: no issues, activities of daily living: nolimitations , seatbelt use : yes , and smoke alarm employment : yes .  Power of Attorney/Living Will status : NO  Vision ( as recorded per Nurse) & Hearing  evaluation :  Ophth exam 2013, Dr Nile Riggs.No hearing exam Orientation :oriented X 3 , memory & recall :good,  math testing:good,and mood & affect : normal . Depression / anxiety: denied Travel history : [place] 1968 , immunization status :Flu today , transfusion history:  no, and preventive health surveillance ( colonoscopies, BMD , etc as per protocol/ [facility]): colonoscopy next year, Dental care:  Every 6 mos . Chart reviewed &  Updated. Active issues reviewed & addressed.       Review of Systems HYPERTENSION: Disease Monitoring: Blood pressure range- 118-123/60-73  Chest pain, palpitations- no       Dyspnea- no Medications: Compliance- no  Lightheadedness,Syncope- no    Edema- no  FASTING HYPERGLYCEMIA, PMH of:  Disease Monitoring: Blood Sugar ranges-< 130  Polyuria/phagia/dipsia- no     Visual problems- no Diet:no snacks  HYPERLIPIDEMIA: Disease Monitoring: See symptoms for Hypertension Medications: Compliance- yes  Abd pain, bowel changes- no   Muscle aches- no         Objective:   Physical Exam Gen.: Thin but healthy and well-nourished in appearance. Alert, appropriate and cooperative throughout exam. Head: Normocephalic without obvious abnormalities;  no alopecia  Eyes: No corneal or conjunctival inflammation noted. Pupils equal round reactive to light and accommodation.  Extraocular motion intact.  Vision grossly normal with lenses. Ears: External  ear exam reveals no significant lesions or deformities. Canals clear .TMs normal. Hearing is grossly slightly decreased bilaterally. Nose: External nasal exam reveals no deformity or inflammation. Nasal mucosa are pink and moist. No lesions or exudates noted.   Mouth: Oral mucosa and oropharynx reveal no lesions or exudates. Teeth in good repair. Neck: No deformities, masses, or tenderness noted. Range of motion & Thyroid normal. Lungs: Normal respiratory effort; chest expands symmetrically. Lungs are clear to auscultation without rales, wheezes, or increased work of breathing. Heart: Normal rate and rhythm. Normal S1 ;split S2. No gallop, click, or rub.S4 w/o murmur. Abdomen: Bowel sounds normal; abdomen soft and nontender. No masses, organomegaly or hernias noted. Genitalia: Dr Annabell Howells                                                           Musculoskeletal/extremities: No deformity or scoliosis noted of  the thoracic or lumbar spine. No clubbing, cyanosis,or  edema noted. Range of motion  Slightly decreased in lower extremities. Minor DJD of fingers .Tone & strength  normal.Joints normal. Nail health  good. Vascular: Carotid, radial artery, dorsalis pedis and  posterior tibial pulses are full and equal. No bruits present. Neurologic: Alert and oriented x3. Deep tendon reflexes symmetrical and normal.  Skin: Intact without suspicious lesions or rashes. Lymph: No cervical, axillary lymphadenopathy present. Psych: Mood and affect are normal. Normally interactive                                                                                         Assessment & Plan:  #1 Medicare Wellness Exam; criteria met ; data entered #2 Problem List reviewed ; Assessment/ Recommendations made Plan: see Orders

## 2012-09-18 NOTE — Patient Instructions (Addendum)
Preventive Health Care: Exercise at least 30-45 minutes a day,  3-4 days a week.  Eat a low-fat diet with lots of fruits and vegetables, up to 7-9 servings per day.  Consume less than 40 grams of sugar per day from foods & drinks with High Fructose Corn Sugar as #1,2,3 or # 4 on label. Eye Doctor - have an eye exam @ least annually.                                                         Health Care Power of Attorney & Living Will. Complete if not in place ; these place you in charge of your health care decisions.  If you activate My Chart; the results can be released to you as soon as they populate from the lab. If you choose not to use this program; the labs have to be reviewed, copied & mailed   causing a delay in getting the results to you.

## 2012-10-06 DIAGNOSIS — H1130 Conjunctival hemorrhage, unspecified eye: Secondary | ICD-10-CM | POA: Diagnosis not present

## 2013-01-02 DIAGNOSIS — N400 Enlarged prostate without lower urinary tract symptoms: Secondary | ICD-10-CM | POA: Diagnosis not present

## 2013-01-05 DIAGNOSIS — N400 Enlarged prostate without lower urinary tract symptoms: Secondary | ICD-10-CM | POA: Diagnosis not present

## 2013-01-05 DIAGNOSIS — N529 Male erectile dysfunction, unspecified: Secondary | ICD-10-CM | POA: Diagnosis not present

## 2013-01-05 DIAGNOSIS — N200 Calculus of kidney: Secondary | ICD-10-CM | POA: Diagnosis not present

## 2013-05-04 DIAGNOSIS — N390 Urinary tract infection, site not specified: Secondary | ICD-10-CM | POA: Diagnosis not present

## 2013-05-04 DIAGNOSIS — R81 Glycosuria: Secondary | ICD-10-CM | POA: Diagnosis not present

## 2013-05-04 DIAGNOSIS — R350 Frequency of micturition: Secondary | ICD-10-CM | POA: Diagnosis not present

## 2013-05-13 ENCOUNTER — Ambulatory Visit (INDEPENDENT_AMBULATORY_CARE_PROVIDER_SITE_OTHER): Payer: Medicare Other | Admitting: Internal Medicine

## 2013-05-13 ENCOUNTER — Encounter: Payer: Self-pay | Admitting: Internal Medicine

## 2013-05-13 DIAGNOSIS — R7309 Other abnormal glucose: Secondary | ICD-10-CM | POA: Diagnosis not present

## 2013-05-13 DIAGNOSIS — R350 Frequency of micturition: Secondary | ICD-10-CM | POA: Diagnosis not present

## 2013-05-13 LAB — HEMOGLOBIN A1C: Hgb A1c MFr Bld: 11.5 % — ABNORMAL HIGH (ref 4.6–6.5)

## 2013-05-13 NOTE — Patient Instructions (Addendum)
Share results with all non [facility] medical staff seen  

## 2013-05-13 NOTE — Progress Notes (Signed)
  Subjective:    Patient ID: Christopher Bradshaw, male    DOB: 05-18-1946, 67 y.o.   MRN: [id number]  HPI For approximately 10 days he's been urinating every 2 hours through the day and night.  He was concerned he might have a urinary tract infection & saw his urologist, Dr. Annabell Howells. Urine glucose was greater than 1000 mg/dL and this has been negative in January  He has not been monitoring his glucose; he did check it today it was 243.  As of 5/19 he's been very circumspect about his diet and the polyuria has resolved. He was not having excessive hunger or excessive thirst.    Review of Systems  Over the last several months he has not been monitoring his sugars, exercising, or following his diabetic diet.  He denies lightheadedness or dizziness. He's had no numbness, tingling, burning in his extremities. He has no nonhealing skin lesions.    Objective:   Physical Exam Appears  Thin but healthy and well-nourished & in no acute distress  No carotid bruits are present.No neck pain distention present at 10 - 15 degrees. Thyroid normal to palpation  Heart rhythm and rate are normal with no significant murmurs or gallops. S4  Chest is clear with no increased work of breathing  There is no evidence of aortic aneurysm or renal artery bruits  Abdomen soft with no organomegaly or masses. No HJR  No clubbing, cyanosis or edema present.  Pedal pulses are intact   No ischemic skin changes are present . Nails healthy    Alert and oriented. Strength, tone, DTRs reflexes normal. Light touch normal over feet.          Assessment & Plan:  #1 polyuria, resolved with nutritional changes  #2 dramatically elevated fasting glucose; A1c in the range of 10.1 anticipated.  Plan: A1c, urine microalbumin

## 2013-05-14 LAB — MICROALBUMIN / CREATININE URINE RATIO
Creatinine,U: 155.7 mg/dL
Microalb, Ur: 1.4 mg/dL (ref 0.0–1.9)

## 2013-05-15 ENCOUNTER — Other Ambulatory Visit: Payer: Self-pay | Admitting: Internal Medicine

## 2013-05-15 MED ORDER — METFORMIN HCL 500 MG PO TABS: 500.0000 mg | ORAL_TABLET | Freq: Two times a day (BID) | ORAL | Status: DC

## 2013-05-15 MED ORDER — GLIMEPIRIDE 2 MG PO TABS: 2.0000 mg | ORAL_TABLET | Freq: Every day | ORAL | Status: DC

## 2013-05-18 ENCOUNTER — Encounter: Payer: Self-pay | Admitting: Internal Medicine

## 2013-05-20 DIAGNOSIS — R350 Frequency of micturition: Secondary | ICD-10-CM | POA: Diagnosis not present

## 2013-05-20 DIAGNOSIS — N476 Balanoposthitis: Secondary | ICD-10-CM | POA: Diagnosis not present

## 2013-05-20 DIAGNOSIS — R81 Glycosuria: Secondary | ICD-10-CM | POA: Diagnosis not present

## 2013-05-20 DIAGNOSIS — N471 Phimosis: Secondary | ICD-10-CM | POA: Diagnosis not present

## 2013-06-29 DIAGNOSIS — H2510 Age-related nuclear cataract, unspecified eye: Secondary | ICD-10-CM | POA: Diagnosis not present

## 2013-06-29 DIAGNOSIS — D3130 Benign neoplasm of unspecified choroid: Secondary | ICD-10-CM | POA: Diagnosis not present

## 2013-06-29 DIAGNOSIS — E119 Type 2 diabetes mellitus without complications: Secondary | ICD-10-CM | POA: Diagnosis not present

## 2013-07-07 ENCOUNTER — Encounter: Payer: Self-pay | Admitting: Internal Medicine

## 2013-07-15 ENCOUNTER — Other Ambulatory Visit (INDEPENDENT_AMBULATORY_CARE_PROVIDER_SITE_OTHER): Payer: Medicare Other

## 2013-07-15 ENCOUNTER — Telehealth: Payer: Self-pay | Admitting: Internal Medicine

## 2013-07-15 DIAGNOSIS — IMO0001 Reserved for inherently not codable concepts without codable children: Secondary | ICD-10-CM

## 2013-07-15 MED ORDER — GLUCOSE BLOOD VI STRP: ORAL_STRIP | Status: DC

## 2013-07-15 NOTE — Telephone Encounter (Signed)
Pt states he needs new rx for freestyle lite test strips sent to [facility] in [place].

## 2013-07-15 NOTE — Telephone Encounter (Signed)
RX sent

## 2013-07-21 ENCOUNTER — Encounter: Payer: Self-pay | Admitting: Internal Medicine

## 2013-08-10 DIAGNOSIS — N478 Other disorders of prepuce: Secondary | ICD-10-CM | POA: Diagnosis not present

## 2013-08-10 DIAGNOSIS — R81 Glycosuria: Secondary | ICD-10-CM | POA: Diagnosis not present

## 2013-08-10 DIAGNOSIS — N476 Balanoposthitis: Secondary | ICD-10-CM | POA: Diagnosis not present

## 2013-08-18 ENCOUNTER — Encounter: Payer: Self-pay | Admitting: Internal Medicine

## 2013-09-28 ENCOUNTER — Telehealth: Payer: Self-pay

## 2013-09-28 NOTE — Telephone Encounter (Signed)
Medication List and allergies: done  Pharmacy updated, uses [address] [place] for all prescriptions   HM UTD: Immunizations due: flu, 2nd PNA if applicable  A/P:  LAST: HM due: PSA: UTD  07/2013 Urology  CCS: [facility] 10/2013  DM:  Due    Eye exam 06/2013 HTN: Due  Lipids: Due   To Discuss with Provider: Patient has not been taking the glimpiride or metformin States that he has been watching his diet, exercising, has lost weight He will bring the list of his glucose and BP readings

## 2013-09-29 ENCOUNTER — Ambulatory Visit (AMBULATORY_SURGERY_CENTER): Payer: Self-pay

## 2013-09-29 ENCOUNTER — Ambulatory Visit (INDEPENDENT_AMBULATORY_CARE_PROVIDER_SITE_OTHER): Payer: Medicare Other | Admitting: Internal Medicine

## 2013-09-29 ENCOUNTER — Encounter: Payer: Self-pay | Admitting: Internal Medicine

## 2013-09-29 VITALS — Ht 70.0 in | Wt 164.2 lb

## 2013-09-29 VITALS — BP 145/71 | HR 79 | Temp 97.9°F | Resp 12 | Ht 70.0 in | Wt 163.2 lb

## 2013-09-29 DIAGNOSIS — R7309 Other abnormal glucose: Secondary | ICD-10-CM

## 2013-09-29 DIAGNOSIS — Z23 Encounter for immunization: Secondary | ICD-10-CM | POA: Diagnosis not present

## 2013-09-29 DIAGNOSIS — Z800 Family history of malignant neoplasm of digestive organs: Secondary | ICD-10-CM

## 2013-09-29 DIAGNOSIS — I10 Essential (primary) hypertension: Secondary | ICD-10-CM | POA: Diagnosis not present

## 2013-09-29 DIAGNOSIS — Z0000 Encounter for general adult medical examination without abnormal findings: Secondary | ICD-10-CM

## 2013-09-29 DIAGNOSIS — E782 Mixed hyperlipidemia: Secondary | ICD-10-CM

## 2013-09-29 LAB — HEPATIC FUNCTION PANEL
AST: 21 U/L (ref 0–37)
Albumin: 4.5 g/dL (ref 3.5–5.2)
Alkaline Phosphatase: 48 U/L (ref 39–117)
Total Bilirubin: 1.1 mg/dL (ref 0.3–1.2)
Total Protein: 7.2 g/dL (ref 6.0–8.3)

## 2013-09-29 LAB — MICROALBUMIN / CREATININE URINE RATIO
Microalb Creat Ratio: 0.7 mg/g (ref 0.0–30.0)
Microalb, Ur: 0.8 mg/dL (ref 0.0–1.9)

## 2013-09-29 LAB — LIPID PANEL
Cholesterol: 161 mg/dL (ref 0–200)
HDL: 59.1 mg/dL (ref >39.00)
LDL Cholesterol: 88 mg/dL (ref 0–99)
VLDL: 14.4 mg/dL (ref 0.0–40.0)

## 2013-09-29 LAB — HEMOGLOBIN A1C: Hgb A1c MFr Bld: 6.3 % (ref 4.6–6.5)

## 2013-09-29 LAB — BASIC METABOLIC PANEL
BUN: 19 mg/dL (ref 6–23)
Calcium: 9.5 mg/dL (ref 8.4–10.5)
GFR: 98.83 mL/min (ref >60.00)
Glucose, Bld: 105 mg/dL — ABNORMAL HIGH (ref 70–99)
Sodium: 140 mEq/L (ref 135–145)

## 2013-09-29 LAB — TSH: TSH: 2.36 u[IU]/mL (ref 0.35–5.50)

## 2013-09-29 MED ORDER — SIMVASTATIN 40 MG PO TABS: 40.0000 mg | ORAL_TABLET | Freq: Every day | ORAL | Status: DC

## 2013-09-29 MED ORDER — BENAZEPRIL HCL 40 MG PO TABS: 40.0000 mg | ORAL_TABLET | Freq: Every day | ORAL | Status: DC

## 2013-09-29 MED ORDER — MOVIPREP 100 G PO SOLR: ORAL | Status: DC

## 2013-09-29 NOTE — Patient Instructions (Signed)
Preventive Health Care: Exercise at least 30-45 minutes a day,  3-4 days a week.  Eat a low-fat diet with lots of fruits and vegetables, up to 7-9 servings per day. This would eliminate the need for vitamin supplements. Consume less than 40 grams of sugar (preferably ZERO) per day from foods & drinks with High Fructose Corn Sugar as #1,2,3 or # 4 on label. Health Care Power of Attorney & Living Will. Complete these if not in place ; these place you in charge of your health care decisions.   Minimal Blood Pressure Goal= AVERAGE < 140/90;  Ideal is an AVERAGE < 135/85. This AVERAGE should be calculated from @ least 5-7 BP readings taken @ different times of day on different days of week. You should not respond to isolated BP readings , but rather the AVERAGE for that week .Please bring your  blood pressure cuff to office visits to verify that it is reliable.It  can also be checked against the blood pressure device at the pharmacy. Finger or wrist cuffs are not dependable; an arm cuff is.

## 2013-09-29 NOTE — Progress Notes (Signed)
Subjective:    Patient ID: Christopher Bradshaw, male    DOB: 23-Aug-1946, 67 y.o.   MRN: [id number]  HPI Medicare Wellness Visit: Psychosocial and medical history were reviewed as required by Medicare (abuse, antisocial behavior risk, forearm risk). Social history: Caffeine: 1 cup coffee/ day  , Alcohol: rarely , Tobacco YQM:VHQIO Exercise:walking 30-70 min/day weather permitting Personal safety/fall risk:no Limitations of activities of daily living:no Seatbelt smoke alarm use:yes Healthcare Power of Attorney/Living Will status: needed Ophthalmologic exam status: current; no retinopathy Hearing evaluation status:not current Orientation: Oriented X3 Memory and recall: good Spelling  testing: good Depression/anxiety assessment: denied Foreign travel history: [employer] in 18s Immunization status the shingles/bleeding/pneumonia/tetanus: Flu today Transfusion history: no Preventive health care maintenance status: Colonoscopy as per protocol/standard care: pending Dental care:every 6 mos Chart reviewed and updated. Active issues reviewed and addressed.    Review of Systems HYPERTENSION: Disease Monitoring: Blood pressure range- 97/59-127/60s Chest pain, palpitations- no       Dyspnea- no Medications: Compliance- yes  Lightheadedness,Syncope- no   Edema- no  FASTING HYPERGLYCEMIA, PMH of:  Polyuria/phagia/dipsia- no       Visual problems- no FBS range- 96- 125; last A1c 7.7% in 7/14  HYPERLIPIDEMIA: Disease Monitoring: See symptoms for Hypertension Medications: Compliance- yes  Abd pain, bowel changes- no   Muscle aches- no     Objective:   Physical Exam Gen.: Thin but healthy and well-nourished in appearance. Alert, appropriate and cooperative throughout exam.Appears younger than stated age  Head: Normocephalic without obvious abnormalities;no alopecia  Eyes: No corneal or conjunctival inflammation noted. Pupils equal round reactive to light and accommodation. Fundal exam is benign  without hemorrhages, exudate, papilledema. Extraocular motion intact.  Ears: External  ear exam reveals no significant lesions or deformities. Canals clear .TMs normal. Hearing is grossly normal bilaterally. Nose: External nasal exam reveals no deformity or inflammation. Nasal mucosa are pink and moist. No lesions or exudates noted.   Mouth: Oral mucosa and oropharynx reveal no lesions or exudates. Teeth in good repair. Neck: No deformities, masses, or tenderness noted. Range of motion & Thyroid normal. Lungs: Normal respiratory effort; chest expands symmetrically. Lungs are clear to auscultation without rales, wheezes, or increased work of breathing. Heart: Normal rate and rhythm. Normal S1 and S2. No gallop, click, or rub. S4 w/o  murmur. Abdomen: Bowel sounds normal; abdomen soft and nontender. No masses, organomegaly or hernias noted. Genitalia: As per Dr Annabell Howells                                  Musculoskeletal/extremities: No deformity or scoliosis noted of  the thoracic or lumbar spine.  No clubbing, cyanosis, edema, or significant extremity  deformity noted. Range of motion normal .Tone & strength  Normal. Joints  reveal minor flexion DIP changes. Nail health good. Able to lie down & sit up w/o help. Negative SLR bilaterally Vascular: Carotid, radial artery, dorsalis pedis and  posterior tibial pulses are full and equal. No bruits present. Neurologic: Alert and oriented x3. Deep tendon reflexes symmetrical and normal.        Skin: Intact without suspicious lesions or rashes. Lymph: No cervical, axillary lymphadenopathy present. Psych: Mood and affect are normal. Normally interactive  Assessment & Plan:  #1 Medicare Wellness Exam; criteria met ; data entered #2 Problem List/Diagnoses reviewed Plan:  Assessments made/ Orders entered  

## 2013-10-28 ENCOUNTER — Ambulatory Visit (AMBULATORY_SURGERY_CENTER): Payer: Medicare Other | Admitting: Internal Medicine

## 2013-10-28 ENCOUNTER — Encounter: Payer: Self-pay | Admitting: Internal Medicine

## 2013-10-28 VITALS — BP 124/70 | HR 57 | Temp 95.7°F | Resp 18 | Ht 70.0 in | Wt 164.0 lb

## 2013-10-28 DIAGNOSIS — Z1211 Encounter for screening for malignant neoplasm of colon: Secondary | ICD-10-CM | POA: Diagnosis not present

## 2013-10-28 DIAGNOSIS — I10 Essential (primary) hypertension: Secondary | ICD-10-CM | POA: Diagnosis not present

## 2013-10-28 DIAGNOSIS — I4510 Unspecified right bundle-branch block: Secondary | ICD-10-CM | POA: Diagnosis not present

## 2013-10-28 DIAGNOSIS — Z800 Family history of malignant neoplasm of digestive organs: Secondary | ICD-10-CM

## 2013-10-28 DIAGNOSIS — N400 Enlarged prostate without lower urinary tract symptoms: Secondary | ICD-10-CM | POA: Diagnosis not present

## 2013-10-28 MED ORDER — SODIUM CHLORIDE 0.9 % IV SOLN: 500.0000 mL | INTRAVENOUS | Status: DC

## 2013-10-28 NOTE — Progress Notes (Signed)
Patient did not experience any of the following events: a burn prior to discharge; a fall within the facility; wrong site/side/patient/procedure/implant event; or a hospital transfer or hospital admission upon discharge from the facility. (G8907) Patient did not have preoperative order for IV antibiotic SSI prophylaxis. (G8918)  

## 2013-10-28 NOTE — Progress Notes (Signed)
Report to pacu rn, vss, bbs=clear 

## 2013-10-28 NOTE — Op Note (Signed)
[facility] [address] [place], [address]   COLONOSCOPY PROCEDURE REPORT  PATIENT: Christopher Bradshaw, Christopher Bradshaw  MR#: [id number] BIRTHDATE: 1946-10-26 , 67  yrs. old GENDER: Male ENDOSCOPIST: Hart Carwin, MD REFERRED XB:[id number] Alwyn Ren, M.D. PROCEDURE DATE:  10/28/2013 PROCEDURE:   Colonoscopy, screening First Screening Colonoscopy - Avg.  risk and is 50 yrs.  old or older - No.  Prior Negative Screening - Now for repeat screening. 10 or more years since last screening Prior Negative Screening - Now for repeat screening.  Above average risk  History of Adenoma - Now for follow-up colonoscopy & has been > or = to 3 yrs.  N/A Polyps Removed Today? No.  Recommend repeat exam, <10 yrs? No. ASA CLASS:   Class II INDICATIONS:Patient's family history of colon cancer, distant relatives.,prior colonoscopy in 2000 and and in September 2007. History of colon cancer in paternal grandfather and paternal uncle  MEDICATIONS: MAC sedation, administered by CRNA and propofol (Diprivan) 200mg  IV  DESCRIPTION OF PROCEDURE:   After the risks benefits and alternatives of the procedure were thoroughly explained, informed consent was obtained.  A digital rectal exam revealed no abnormalities of the rectum.   The LB [id number]  endoscope was introduced through the anus and advanced to the cecum, which was identified by both the appendix and ileocecal valve. No adverse events experienced.   The quality of the prep was Prepopik excellent  The instrument was then slowly withdrawn as the colon was fully examined.      COLON FINDINGS: A normal appearing cecum, ileocecal valve, and appendiceal orifice were identified.  The ascending, hepatic flexure, transverse, splenic flexure, descending, sigmoid colon and rectum appeared unremarkable.  No polyps or cancers were seen. Retroflexed views revealed no abnormalities. The time to cecum=3 minutes 12 seconds.  Withdrawal time=7 minutes 22  seconds.  The scope was withdrawn and the procedure completed. COMPLICATIONS: There were no complications.  ENDOSCOPIC IMPRESSION: Normal colon  RECOMMENDATIONS: 1.  High fiber diet 2.   recall colonoscopy in 10 years   eSigned:  Hart Carwin, MD 10/28/2013 9:59 AM   cc:

## 2013-10-28 NOTE — Progress Notes (Signed)
No egg or soy allergy. emw 

## 2013-10-28 NOTE — Patient Instructions (Signed)

## 2013-10-29 ENCOUNTER — Telehealth: Payer: Self-pay

## 2013-10-29 NOTE — Telephone Encounter (Signed)
  Follow up Call-  Call back number 10/28/2013  Post procedure Call Back phone  # [phone]  Permission to leave phone message Yes     Patient questions:  Do you have a fever, pain , or abdominal swelling? no Pain Score  0 *  Have you tolerated food without any problems? yes  Have you been able to return to your normal activities? yes  Do you have any questions about your discharge instructions: Diet   no Medications  no Follow up visit  no  Do you have questions or concerns about your Care? no  Actions: * If pain score is 4 or above: No action needed, pain <4.

## 2013-12-17 DIAGNOSIS — G459 Transient cerebral ischemic attack, unspecified: Secondary | ICD-10-CM

## 2013-12-17 HISTORY — DX: Transient cerebral ischemic attack, unspecified: G45.9

## 2014-02-04 ENCOUNTER — Other Ambulatory Visit: Payer: Self-pay | Admitting: *Deleted

## 2014-02-09 ENCOUNTER — Ambulatory Visit: Payer: Medicare Other | Admitting: Internal Medicine

## 2014-02-11 ENCOUNTER — Ambulatory Visit: Payer: Medicare Other | Admitting: Internal Medicine

## 2014-02-12 ENCOUNTER — Ambulatory Visit (INDEPENDENT_AMBULATORY_CARE_PROVIDER_SITE_OTHER): Payer: Medicare Other | Admitting: Internal Medicine

## 2014-02-12 ENCOUNTER — Encounter: Payer: Self-pay | Admitting: Internal Medicine

## 2014-02-12 VITALS — BP 120/80 | HR 76 | Temp 97.6°F | Wt 166.6 lb

## 2014-02-12 DIAGNOSIS — J209 Acute bronchitis, unspecified: Secondary | ICD-10-CM | POA: Diagnosis not present

## 2014-02-12 MED ORDER — HYDROCODONE-HOMATROPINE 5-1.5 MG/5ML PO SYRP: 5.0000 mL | ORAL_SOLUTION | Freq: Four times a day (QID) | ORAL | Status: DC | PRN

## 2014-02-12 MED ORDER — AZITHROMYCIN 250 MG PO TABS: ORAL_TABLET | ORAL | Status: DC

## 2014-02-12 NOTE — Progress Notes (Signed)
Pre visit review using our clinic review tool, if applicable. No additional management support is needed unless otherwise documented below in the visit note. 

## 2014-02-12 NOTE — Progress Notes (Signed)
   Subjective:    Patient ID: Christopher Bradshaw, male    DOB: 13-Oct-1946, 68 y.o.   MRN: [id number]  HPI Symptoms began Sunday as sore throat, progressing to persistent cough. Reports he had Frontal HA, clear nasal discharge, itchy watery eyes. Began Mucinex (Plain), Cold-Eeze, tylenol on Monday.  Reports a few nights ago, he awoke in cold sweat.  All symptoms have subsided except for cough, soreness in chest wall from coughing spells.  No sick contacts. Cannot recall last ABX use; does not take abx prophylactic before dental work.   Review of Systems Denies chills, n/v. Denies otalgia or otorrhea, myalgias.     Objective:   Physical Exam General appearance:good health ;well nourished; no acute distress or increased work of breathing is present.  No  lymphadenopathy about the head, neck, or axilla noted.   Eyes: No conjunctival inflammation or lid edema is present. There is no scleral icterus.  Ears:  External ear exam shows no significant lesions or deformities.  Otoscopic examination reveals clear canals, tympanic membranes are intact bilaterally without bulging, retraction, inflammation or discharge.  Nose:  External nasal examination shows no deformity or inflammation. Nasal mucosa are pink and moist without lesions or exudates. No septal dislocation or deviation.No obstruction to airflow.   Oral exam: Dental hygiene is good; lips and gums are healthy appearing.There is mild oropharyngeal erythema without exudate.   Neck:  No deformities, thyromegaly, masses, or tenderness noted.   Supple with full range of motion without pain.   Heart:  Normal rate and regular rhythm. S1 and S2 normal without gallop, murmur, click, rub. Soft S4.   Lungs:Chest clear to auscultation; no wheezes, rhonchi,rales ,or rubs present.No increased work of breathing.  Chest discomfort with palpation at costal margins bilaterally.   Extremities:  No cyanosis, edema, or clubbing noted.   Skin: Warm & dry w/o  jaundice or tenting.      Assessment & Plan:  1.  2.

## 2014-02-12 NOTE — Progress Notes (Signed)
   Subjective:    Patient ID: Christopher Bradshaw, male    DOB: [birth year], 68 y.o.   MRN: [id number]  HPI Symptoms began Sunday as sore throat, progressing to persistent cough. Reports he had  frontal HA, clear nasal discharge, itchy watery eyes. Began Mucinex (Plain), Cold-Eze, tylenol on Monday.  Reports a few nights ago, he awoke in cold sweat.  All symptoms have subsided except for cough, soreness in chest wall from coughing spells. Scant yellow sputum No sick contacts. Cannot recall last ABX use; does not take abx prophylactic before dental work.    Review of Systems Denies chills, n/v. Denies otalgia or otorrhea, myalgias.      Objective:   Physical Exam General appearance:good health ;well nourished; no acute distress or increased work of breathing is present. No lymphadenopathy about the head, neck, or axilla noted.  Eyes: No conjunctival inflammation or lid edema is present. There is no scleral icterus.  Ears: External ear exam shows no significant lesions or deformities. Otoscopic examination reveals clear canals, tympanic membranes are intact bilaterally without bulging, retraction, inflammation or discharge.  Nose: External nasal examination shows no deformity or inflammation. Nasal mucosa are pink and moist without lesions or exudates. No septal dislocation or deviation.No obstruction to airflow.  Oral exam: Dental hygiene is good; lips and gums are healthy appearing.There is mild oropharyngeal erythema without exudate.  Neck: No deformities,  masses, or tenderness noted. Supple with full range of motion without pain.  Heart: Normal rate and regular rhythm. S1 and S2 normal without gallop, murmur, click, rub. Soft S4.  Lungs:Chest clear to auscultation; no wheezes, rhonchi,rales ,or rubs present.No increased work of breathing.  Extremities: No cyanosis, edema, or clubbing noted.  Skin: Warm & dry w/o jaundice or tenting.        Assessment & Plan:  #1 acute bronchitis w/o  bronchospasm #2 rhinitis Plan: See orders and recommendations

## 2014-02-12 NOTE — Patient Instructions (Addendum)
Plain Mucinex (NOT D) for thick secretions ;force NON dairy fluids .   Nasal cleansing in the shower as discussed with lather of mild shampoo.After 10 seconds wash off lather while  exhaling through nostrils. Make sure that all residual soap is removed to prevent irritation.  Flonase OR Nasacort AQ 1 spray in each nostril twice a day as needed. Use the "crossover" technique into opposite nostril spraying toward opposite ear @ 45 degree angle, not straight up into nostril.  Use a Neti pot daily only  as needed for significant sinus congestion; going from open side to congested side . Plain Allegra (NOT D )  160 daily , Loratidine 10 mg , OR Zyrtec 10 mg @ bedtime  as needed for itchy eyes & sneezing. Fill the  prescription for Zithromax it there is not dramatic improvement in the next 48 hours. 

## 2014-05-29 DIAGNOSIS — J9819 Other pulmonary collapse: Secondary | ICD-10-CM | POA: Diagnosis not present

## 2014-05-29 DIAGNOSIS — R0609 Other forms of dyspnea: Secondary | ICD-10-CM | POA: Diagnosis not present

## 2014-05-29 DIAGNOSIS — R599 Enlarged lymph nodes, unspecified: Secondary | ICD-10-CM | POA: Diagnosis not present

## 2014-05-29 DIAGNOSIS — I10 Essential (primary) hypertension: Secondary | ICD-10-CM | POA: Diagnosis not present

## 2014-05-29 DIAGNOSIS — R5383 Other fatigue: Secondary | ICD-10-CM | POA: Diagnosis not present

## 2014-05-29 DIAGNOSIS — E860 Dehydration: Secondary | ICD-10-CM | POA: Diagnosis not present

## 2014-05-29 DIAGNOSIS — R269 Unspecified abnormalities of gait and mobility: Secondary | ICD-10-CM | POA: Diagnosis not present

## 2014-05-29 DIAGNOSIS — R4182 Altered mental status, unspecified: Secondary | ICD-10-CM | POA: Diagnosis not present

## 2014-05-29 DIAGNOSIS — G459 Transient cerebral ischemic attack, unspecified: Secondary | ICD-10-CM | POA: Diagnosis not present

## 2014-05-29 DIAGNOSIS — R5381 Other malaise: Secondary | ICD-10-CM | POA: Diagnosis not present

## 2014-05-29 DIAGNOSIS — I6789 Other cerebrovascular disease: Secondary | ICD-10-CM | POA: Diagnosis not present

## 2014-05-29 DIAGNOSIS — I2510 Atherosclerotic heart disease of native coronary artery without angina pectoris: Secondary | ICD-10-CM | POA: Diagnosis not present

## 2014-05-29 DIAGNOSIS — E7800 Pure hypercholesterolemia, unspecified: Secondary | ICD-10-CM | POA: Diagnosis not present

## 2014-05-29 DIAGNOSIS — G9340 Encephalopathy, unspecified: Secondary | ICD-10-CM | POA: Diagnosis not present

## 2014-05-29 DIAGNOSIS — I4510 Unspecified right bundle-branch block: Secondary | ICD-10-CM | POA: Diagnosis not present

## 2014-05-29 DIAGNOSIS — R404 Transient alteration of awareness: Secondary | ICD-10-CM | POA: Diagnosis not present

## 2014-05-29 DIAGNOSIS — R930 Abnormal findings on diagnostic imaging of skull and head, not elsewhere classified: Secondary | ICD-10-CM | POA: Diagnosis not present

## 2014-05-29 DIAGNOSIS — M6281 Muscle weakness (generalized): Secondary | ICD-10-CM | POA: Diagnosis not present

## 2014-05-29 DIAGNOSIS — Z79899 Other long term (current) drug therapy: Secondary | ICD-10-CM | POA: Diagnosis not present

## 2014-05-30 DIAGNOSIS — I4510 Unspecified right bundle-branch block: Secondary | ICD-10-CM | POA: Diagnosis not present

## 2014-05-30 DIAGNOSIS — I359 Nonrheumatic aortic valve disorder, unspecified: Secondary | ICD-10-CM | POA: Diagnosis not present

## 2014-05-30 DIAGNOSIS — I059 Rheumatic mitral valve disease, unspecified: Secondary | ICD-10-CM | POA: Diagnosis not present

## 2014-05-30 DIAGNOSIS — I658 Occlusion and stenosis of other precerebral arteries: Secondary | ICD-10-CM | POA: Diagnosis not present

## 2014-05-30 DIAGNOSIS — I369 Nonrheumatic tricuspid valve disorder, unspecified: Secondary | ICD-10-CM | POA: Diagnosis not present

## 2014-05-31 ENCOUNTER — Other Ambulatory Visit (INDEPENDENT_AMBULATORY_CARE_PROVIDER_SITE_OTHER): Payer: Medicare Other

## 2014-05-31 ENCOUNTER — Ambulatory Visit (INDEPENDENT_AMBULATORY_CARE_PROVIDER_SITE_OTHER): Payer: Medicare Other | Admitting: Internal Medicine

## 2014-05-31 ENCOUNTER — Encounter: Payer: Self-pay | Admitting: Internal Medicine

## 2014-05-31 ENCOUNTER — Telehealth: Payer: Self-pay | Admitting: *Deleted

## 2014-05-31 VITALS — BP 128/86 | HR 65 | Temp 97.7°F | Wt 166.8 lb

## 2014-05-31 DIAGNOSIS — R7309 Other abnormal glucose: Secondary | ICD-10-CM

## 2014-05-31 DIAGNOSIS — I10 Essential (primary) hypertension: Secondary | ICD-10-CM | POA: Diagnosis not present

## 2014-05-31 DIAGNOSIS — G459 Transient cerebral ischemic attack, unspecified: Secondary | ICD-10-CM | POA: Diagnosis not present

## 2014-05-31 LAB — HEMOGLOBIN A1C: HEMOGLOBIN A1C: 6.4 % (ref 4.6–6.5)

## 2014-05-31 NOTE — Patient Instructions (Addendum)
Please take enteric-coated aspirin 325  mg daily with breakfast.  Minimal Blood Pressure Goal= AVERAGE < 140/90;  Ideal is an AVERAGE < 135/85. This AVERAGE should be calculated from @ least 5-7 BP readings taken @ different times of day on different days of week. You should not respond to isolated BP readings , but rather the AVERAGE for that week .Please bring your  blood pressure cuff to office visits to verify that it is reliable.It  can also be checked against the blood pressure device at the pharmacy. Finger or wrist cuffs are not dependable; an arm cuff is. 

## 2014-05-31 NOTE — Telephone Encounter (Signed)
[facility] Triage Call Report Triage Record Num: [id number] Operator: Liana Gerold Patient Name: Christopher Bradshaw Call Date & Time: 05/29/2014 10:55:21AM Patient Phone: [phone] PCP: Unice Cobble Patient Gender: Male PCP Fax : [phone] Patient DOB: 01-Feb-1946 Practice Name: [facility] Reason for Call: Caller: Laurine Blazer; PCP: Unice Cobble; CB#: [phone]; Call regarding Confusion; Onset 01/02/56 Pt's wife/Alice states pt is confused, disorientaed, "does not know who he is". Emergent symptoms New or worsening neuro deficitis and new or worsening confusion, disorientation or agitaion positive per Confusion, Disorientation or Agitation protocol. Instructed caller to hang up and call 911. Protocol(s) Used: Confusion, Disorientation, Agitation Recommended Outcome per Protocol: Activate EMS 911 Reason for Outcome: New or worsening neuro deficits AND new or worsening confusion, disorientation or agitation Care Advice: ~ 06/

## 2014-05-31 NOTE — Progress Notes (Signed)
   Subjective:    Patient ID: Christopher Bradshaw, male    DOB: [birth year], 68 y.o.   MRN: [id number]  HPI He was observed in [facility] 6/13- 6-14/15 for acute delirium and encephalopathy attributed to a transischemic intact. Initially he was found hypertensive with a blood pressure 186/86 . He presented with acute confusion and mild motor weakness. There was rapid improvement; MRI revealed no acute cerebrovascular accident.Carotid Dopplers were unremarkable. At discharge he was asymptomatic. Negative were CTA of chest ,CT of the abdomen and pelvis.  The carotid Dopplers showed less than 50% stenosis. Vertebral artery patency was normal  Labs included CBC and differential; chemistries; arterial blood gases; cardiac enzymes; TSH. All were normal or negative.   The elevated blood pressures did improve. He was disc harged on 2 baby aspirin daily.    Blood pressure range / average : 105/58 today Compliant with anti hypertemsive medication. No lightheadedness or other adverse medication effect described.  A heart healthy /low salt diet is followed. Exercise encompasses 45-60 minutes up to 7 times per week as walking without symptoms.  Family history is negative for CVA    Review of Systems    Since discharge no significant headaches, epistaxis, chest pain, palpitations, exertional dyspnea, claudication, paroxysmal nocturnal dyspnea, or edema absent.  He has no history of paroxysmal hypertension or tachycardia. He has no constellation of headache, chest pain, flushing, or diarrhea.  He describes major stress of caring for his mother who has severe dementia with behavioral issues.       Objective:   Physical Exam Appears thin but  healthy and well-nourished & in no acute distress  No carotid bruits are present.No neck pain distention present at 10 - 15 degrees. Thyroid normal to palpation  Heart rhythm and rate are normal with no gallop or murmur  Chest is clear with no increased  work of breathing  There is no evidence of aortic aneurysm or renal artery bruits  Abdomen soft with no organomegaly or masses. No HJR  No clubbing, cyanosis or edema present. Flexion 5th fingers.  Pedal pulses are intact   No ischemic skin changes are present . Fingernails healthy   Alert and oriented. Strength, tone, DTRs reflexes normal         Assessment & Plan:  #1 TIA  #2 episodic marked BP elevation See orders

## 2014-05-31 NOTE — Progress Notes (Signed)
Pre visit review using our clinic review tool, if applicable. No additional management support is needed unless otherwise documented below in the visit note. 

## 2014-08-02 DIAGNOSIS — D3130 Benign neoplasm of unspecified choroid: Secondary | ICD-10-CM | POA: Diagnosis not present

## 2014-08-02 DIAGNOSIS — E119 Type 2 diabetes mellitus without complications: Secondary | ICD-10-CM | POA: Diagnosis not present

## 2014-08-02 DIAGNOSIS — H2510 Age-related nuclear cataract, unspecified eye: Secondary | ICD-10-CM | POA: Diagnosis not present

## 2014-08-16 DIAGNOSIS — N529 Male erectile dysfunction, unspecified: Secondary | ICD-10-CM | POA: Diagnosis not present

## 2014-08-16 DIAGNOSIS — N400 Enlarged prostate without lower urinary tract symptoms: Secondary | ICD-10-CM | POA: Diagnosis not present

## 2014-10-08 ENCOUNTER — Other Ambulatory Visit (INDEPENDENT_AMBULATORY_CARE_PROVIDER_SITE_OTHER): Payer: Medicare Other

## 2014-10-08 ENCOUNTER — Encounter: Payer: Self-pay | Admitting: Internal Medicine

## 2014-10-08 ENCOUNTER — Ambulatory Visit (INDEPENDENT_AMBULATORY_CARE_PROVIDER_SITE_OTHER): Payer: Medicare Other | Admitting: Internal Medicine

## 2014-10-08 ENCOUNTER — Other Ambulatory Visit: Payer: Self-pay | Admitting: Internal Medicine

## 2014-10-08 VITALS — BP 158/100 | HR 65 | Temp 97.7°F | Resp 13 | Ht 72.0 in | Wt 173.2 lb

## 2014-10-08 DIAGNOSIS — Z23 Encounter for immunization: Secondary | ICD-10-CM

## 2014-10-08 DIAGNOSIS — R7301 Impaired fasting glucose: Secondary | ICD-10-CM | POA: Diagnosis not present

## 2014-10-08 DIAGNOSIS — E782 Mixed hyperlipidemia: Secondary | ICD-10-CM

## 2014-10-08 DIAGNOSIS — I10 Essential (primary) hypertension: Secondary | ICD-10-CM

## 2014-10-08 LAB — BASIC METABOLIC PANEL
BUN: 19 mg/dL (ref 6–23)
CALCIUM: 9.1 mg/dL (ref 8.4–10.5)
CHLORIDE: 107 meq/L (ref 96–112)
CO2: 22 mEq/L (ref 19–32)
CREATININE: 0.9 mg/dL (ref 0.4–1.5)
GFR: 88.5 mL/min (ref >60.00)
Glucose, Bld: 110 mg/dL — ABNORMAL HIGH (ref 70–99)
Potassium: 3.8 mEq/L (ref 3.5–5.1)
Sodium: 139 mEq/L (ref 135–145)

## 2014-10-08 LAB — HEPATIC FUNCTION PANEL
ALBUMIN: 3.8 g/dL (ref 3.5–5.2)
ALT: 19 U/L (ref 0–53)
AST: 23 U/L (ref 0–37)
Alkaline Phosphatase: 46 U/L (ref 39–117)
Bilirubin, Direct: 0.2 mg/dL (ref 0.0–0.3)
Total Bilirubin: 1.3 mg/dL — ABNORMAL HIGH (ref 0.2–1.2)
Total Protein: 7.1 g/dL (ref 6.0–8.3)

## 2014-10-08 LAB — MICROALBUMIN / CREATININE URINE RATIO
Creatinine,U: 52.2 mg/dL
MICROALB UR: 0.4 mg/dL (ref 0.0–1.9)
Microalb Creat Ratio: 0.8 mg/g (ref 0.0–30.0)

## 2014-10-08 LAB — HEMOGLOBIN A1C: HEMOGLOBIN A1C: 6.6 % — AB (ref 4.6–6.5)

## 2014-10-08 LAB — TSH: TSH: 3.35 u[IU]/mL (ref 0.35–4.50)

## 2014-10-08 MED ORDER — BENAZEPRIL HCL 40 MG PO TABS: 40.0000 mg | ORAL_TABLET | Freq: Every day | ORAL | Status: DC

## 2014-10-08 MED ORDER — FREESTYLE LANCETS MISC: Status: DC

## 2014-10-08 MED ORDER — SIMVASTATIN 40 MG PO TABS: 40.0000 mg | ORAL_TABLET | Freq: Every day | ORAL | Status: DC

## 2014-10-08 MED ORDER — GLUCOSE BLOOD VI STRP: ORAL_STRIP | Status: DC

## 2014-10-08 NOTE — Progress Notes (Signed)
   Subjective:    Patient ID: Christopher Bradshaw, male    DOB: 1946-07-02, 68 y.o.   MRN: [id number]  HPI  He is here to assess status of active health conditions:  Diet/ nutrition: low salt, not heart healthy Exercise program: walks 1 hr / day  HYPERTENSION: Disease Monitoring: Blood pressure range/ average : 120s/70s Medication Compliance:yes  FASTING HYPERGLYCEMIA  :  FBS average: 108 Highest 2 hr post meal glucose:no Medication compliance:no med Hypoglycemia:no Ophthamology care: 08/02/14 Podiatry care:no  HYPERLIPIDEMIA: Disease Monitoring: Medication Compliance:yes    Review of Systems  Chest pain, palpitations: no      Dyspnea:no Edema:no Claudication: no Lightheadedness,Syncope:no Weight gain/loss: stable Polyuria/phagia/dipsia: no     Blurred vision /diplopia/lossof vision:no Limb numbness/tingling/burning:no Non healing skin lesions:no Abd pain, bowel changes: no  Myalgias: no Memory loss:no        Objective:   Physical Exam Gen.: Thin but healthy  & well-nourished, appropriate and alert, weight Eyes: No lid/conjunctival changes, extraocular motion intact Neck: Normal range of motion, thyroid Respiratory: No increased work of breathing or abnormal breath sounds Cardiac : regular rhythm, no extra heart sounds, gallop, murmur Abdomen: No organomegaly ,masses, bruits or aortic enlargement.Aorta palpable ; no AAA Lymph: No lymphadenopathy of the neck or axilla Skin: No rashes, lesions, ulcers or ischemic changes Muscle skeletal: minor thickening & discoloration of toe nails; minor flexion changes 5th R digits; minor hammer toe changes Vasc:All pulses intact, no bruits present Neuro: Normal deep tendon reflexes, alert & oriented, sensation over feet intact Psych: judgment and insight, mood and affect normal        Assessment & Plan:  See Current Assessment & Plan in Problem List under specific Diagnosis

## 2014-10-08 NOTE — Patient Instructions (Signed)
Your next office appointment will be determined based upon review of your pending labs. Those instructions will be transmitted to you through My Chart . 

## 2014-10-08 NOTE — Assessment & Plan Note (Signed)
Blood pressure goals reviewed. BMET 

## 2014-10-08 NOTE — Assessment & Plan Note (Signed)
08/16/14 PSA 1.81

## 2014-10-08 NOTE — Progress Notes (Signed)
Pre visit review using our clinic review tool, if applicable. No additional management support is needed unless otherwise documented below in the visit note. 

## 2014-10-08 NOTE — Assessment & Plan Note (Addendum)
08/02/14 Ophthalmology exam negative for retinopathy; Dr Gershon Crane A1c , urine microalbumin, BMET

## 2014-10-08 NOTE — Assessment & Plan Note (Signed)
NMR Lipoprofile, hepatic panel,TSH 

## 2014-10-09 ENCOUNTER — Other Ambulatory Visit: Payer: Self-pay | Admitting: Internal Medicine

## 2014-10-09 DIAGNOSIS — E1151 Type 2 diabetes mellitus with diabetic peripheral angiopathy without gangrene: Secondary | ICD-10-CM

## 2014-10-11 ENCOUNTER — Telehealth: Payer: Self-pay | Admitting: Internal Medicine

## 2014-10-11 ENCOUNTER — Other Ambulatory Visit: Payer: Self-pay

## 2014-10-11 LAB — NMR LIPOPROFILE WITH LIPIDS
Cholesterol, Total: 163 mg/dL (ref 100–199)
HDL Particle Number: 34.6 umol/L
HDL Size: 9.3 nm
HDL-C: 63 mg/dL
LDL (calc): 88 mg/dL (ref 0–99)
LDL Particle Number: 1068 nmol/L — ABNORMAL HIGH
LDL Size: 21 nm
LP-IR Score: <25
Large HDL-P: 10.1 umol/L
Large VLDL-P: <0.8 nmol/L
Small LDL Particle Number: 241 nmol/L
Triglycerides: 58 mg/dL (ref 0–149)
VLDL Size: 37.3 nm

## 2014-10-11 MED ORDER — GLUCOSE BLOOD VI STRP: ORAL_STRIP | Status: DC

## 2014-10-11 MED ORDER — FREESTYLE LANCETS MISC: Status: DC

## 2014-10-11 NOTE — Telephone Encounter (Signed)
emmi emailed °

## 2014-10-11 NOTE — Telephone Encounter (Signed)
Needed to change ICD code on DM supplies

## 2014-10-12 ENCOUNTER — Encounter: Payer: Self-pay | Admitting: Internal Medicine

## 2015-01-04 ENCOUNTER — Encounter: Payer: Self-pay | Admitting: Internal Medicine

## 2015-01-04 ENCOUNTER — Ambulatory Visit (INDEPENDENT_AMBULATORY_CARE_PROVIDER_SITE_OTHER)
Admission: RE | Admit: 2015-01-04 | Discharge: 2015-01-04 | Disposition: A | Discharge status: Home / Self Care | Payer: Medicare Other | Source: Ambulatory Visit | Attending: Internal Medicine | Admitting: Internal Medicine

## 2015-01-04 ENCOUNTER — Ambulatory Visit (INDEPENDENT_AMBULATORY_CARE_PROVIDER_SITE_OTHER): Payer: Medicare Other | Admitting: Internal Medicine

## 2015-01-04 VITALS — BP 160/100 | HR 82 | Temp 97.7°F | Ht 72.0 in | Wt 181.0 lb

## 2015-01-04 DIAGNOSIS — I10 Essential (primary) hypertension: Secondary | ICD-10-CM | POA: Diagnosis not present

## 2015-01-04 DIAGNOSIS — J069 Acute upper respiratory infection, unspecified: Secondary | ICD-10-CM | POA: Diagnosis not present

## 2015-01-04 DIAGNOSIS — J209 Acute bronchitis, unspecified: Secondary | ICD-10-CM

## 2015-01-04 DIAGNOSIS — R05 Cough: Secondary | ICD-10-CM | POA: Diagnosis not present

## 2015-01-04 DIAGNOSIS — E119 Type 2 diabetes mellitus without complications: Secondary | ICD-10-CM | POA: Diagnosis not present

## 2015-01-04 DIAGNOSIS — I517 Cardiomegaly: Secondary | ICD-10-CM | POA: Diagnosis not present

## 2015-01-04 DIAGNOSIS — R0989 Other specified symptoms and signs involving the circulatory and respiratory systems: Secondary | ICD-10-CM | POA: Diagnosis not present

## 2015-01-04 MED ORDER — AMOXICILLIN-POT CLAVULANATE 875-125 MG PO TABS: 1.0000 | ORAL_TABLET | Freq: Two times a day (BID) | ORAL | Status: DC

## 2015-01-04 MED ORDER — PREDNISONE 20 MG PO TABS: 20.0000 mg | ORAL_TABLET | Freq: Two times a day (BID) | ORAL | Status: DC

## 2015-01-04 MED ORDER — HYDROCODONE-HOMATROPINE 5-1.5 MG/5ML PO SYRP: 5.0000 mL | ORAL_SOLUTION | Freq: Four times a day (QID) | ORAL | Status: DC | PRN

## 2015-01-04 NOTE — Progress Notes (Signed)
   Subjective:    Patient ID: Christopher Bradshaw, male    DOB: [birth year], 69 y.o.   MRN: [id number]  HPI  Symptoms began 1 week ago as "cold " and cough  The cough progressed to severe paroxysms of cough with production of clear/yellow sputum. He also has yellow nasal secretions of equal volume  He describes significant throat congestion. He also describes sneezing in addition to wheezing with the paroxysms of cough.  He has frontal sinus pressure as well as dental pain  The left ear feels full.  He's had associated fatigue  The cough is better when he is prone.  He took 3 of his wife's amoxicillin 1/17.  He has been taking over-the-counter products like Mucinex, Cold-Eeze and Robitussin. He is unsure whether he has been taking a decongestant. He states his blood pressure at home is 125-128/85 usually. He took his blood pressures late today. He has no active cardiopulmonary symptoms.  He states that his fasting blood sugars are averaging 125. His last A1c was 6.6% on 10/08/14.  He has never smoked. He has no history of asthma.  He is on ACE inhibitor.  Review of Systems  He denies chills, fever, or sweats.   Chest pain, palpitations, tachycardia, exertional dyspnea, paroxysmal nocturnal dyspnea, claudication or edema are absent.      Objective:   Physical Exam  Pertinent or positive findings include: The right tympanic membrane is dull. There is intermittent splitting of the second heart sound. He has diffuse musical rhonchi and wheezing in all lung fields.Cough is loose & rattley.  General appearance:good health ;well nourished; no acute distress or increased work of breathing is present.  No  lymphadenopathy about the head, neck, or axilla noted.   Eyes: No conjunctival inflammation or lid edema is present. There is no scleral icterus.  Ears:  External ear exam shows no significant lesions or deformities.  Otoscopic examination reveals clear canals, tympanic membranes are  intact bilaterally without bulging, retraction, inflammation or discharge.  Nose:  External nasal examination shows no deformity or inflammation. Nasal mucosa are pink and moist without lesions or exudates. No septal dislocation or deviation.No obstruction to airflow.   Oral exam: Dental hygiene is good; lips and gums are healthy appearing.There is no oropharyngeal erythema or exudate noted.   Neck:  No deformities, thyromegaly, masses, or tenderness noted.   Supple with full range of motion without pain.   Heart:  Normal rate and regular rhythm. S1 normal without gallop, murmur, click, or ub .   Lungs:No increased work of breathing.    Extremities:  No cyanosis, edema, or clubbing  noted    Skin: Warm but slightly damp w/o jaundice or tenting.       Assessment & Plan:  #1 acute bronchitis with bronchospasm  #2 upper respiratory tract infection  #3 hypertension; home average is good. He did take his blood pressure medication late today. He may have taken decongestants as per the over-the-counter medicines. Need for avoidance of decongestions was explained.  #4 diabetes, adequate control. He was asked to monitor glucoses closely on the oral steroids.

## 2015-01-04 NOTE — Patient Instructions (Signed)
Plain Mucinex (NOT D) for thick secretions ;force NON dairy fluids .   Nasal cleansing in the shower as discussed with lather of mild shampoo.After 10 seconds wash off lather while  exhaling through nostrils. Make sure that all residual soap is removed to prevent irritation.  Flonase OR Nasacort AQ 1 spray in each nostril twice a day as needed. Use the "crossover" technique into opposite nostril spraying toward opposite ear @ 45 degree angle, not straight up into nostril.  Plain Allegra (NOT D )  160 daily , Loratidine 10 mg , OR Zyrtec 10 mg @ bedtime  as needed for itchy eyes & sneezing.  Minimal Blood Pressure Goal= AVERAGE < 140/90;  Ideal is an AVERAGE < 135/85. This AVERAGE should be calculated from @ least 5-7 BP readings taken @ different times of day on different days of week. You should not respond to isolated BP readings , but rather the AVERAGE for that week .Please bring your  blood pressure cuff to office visits to verify that it is reliable.It  can also be checked against the blood pressure device at the pharmacy. Finger or wrist cuffs are not dependable; an arm cuff is.

## 2015-01-04 NOTE — Progress Notes (Signed)
Pre visit review using our clinic review tool, if applicable. No additional management support is needed unless otherwise documented below in the visit note. 

## 2015-03-21 DIAGNOSIS — J309 Allergic rhinitis, unspecified: Secondary | ICD-10-CM | POA: Diagnosis not present

## 2015-04-11 ENCOUNTER — Ambulatory Visit (INDEPENDENT_AMBULATORY_CARE_PROVIDER_SITE_OTHER): Payer: Medicare Other | Admitting: Internal Medicine

## 2015-04-11 ENCOUNTER — Other Ambulatory Visit (INDEPENDENT_AMBULATORY_CARE_PROVIDER_SITE_OTHER): Payer: Medicare Other

## 2015-04-11 ENCOUNTER — Encounter: Payer: Self-pay | Admitting: Internal Medicine

## 2015-04-11 VITALS — BP 140/96 | HR 89 | Temp 98.0°F | Resp 15 | Ht 72.0 in | Wt 174.0 lb

## 2015-04-11 DIAGNOSIS — J3089 Other allergic rhinitis: Secondary | ICD-10-CM | POA: Diagnosis not present

## 2015-04-11 DIAGNOSIS — J209 Acute bronchitis, unspecified: Secondary | ICD-10-CM | POA: Diagnosis not present

## 2015-04-11 DIAGNOSIS — E1151 Type 2 diabetes mellitus with diabetic peripheral angiopathy without gangrene: Secondary | ICD-10-CM

## 2015-04-11 DIAGNOSIS — E1159 Type 2 diabetes mellitus with other circulatory complications: Secondary | ICD-10-CM | POA: Diagnosis not present

## 2015-04-11 LAB — HEMOGLOBIN A1C: Hgb A1c MFr Bld: 7.7 % — ABNORMAL HIGH (ref 4.6–6.5)

## 2015-04-11 MED ORDER — SULFAMETHOXAZOLE-TRIMETHOPRIM 800-160 MG PO TABS: 1.0000 | ORAL_TABLET | Freq: Two times a day (BID) | ORAL | Status: DC

## 2015-04-11 MED ORDER — MONTELUKAST SODIUM 10 MG PO TABS: 10.0000 mg | ORAL_TABLET | Freq: Every day | ORAL | Status: DC

## 2015-04-11 MED ORDER — HYDROCODONE-HOMATROPINE 5-1.5 MG/5ML PO SYRP
5.0000 mL | ORAL_SOLUTION | Freq: Four times a day (QID) | ORAL | Status: DC | PRN
Start: 2015-04-11 — End: 2016-08-31

## 2015-04-11 MED ORDER — PREDNISONE 20 MG PO TABS: 20.0000 mg | ORAL_TABLET | Freq: Two times a day (BID) | ORAL | Status: DC

## 2015-04-11 NOTE — Patient Instructions (Signed)

## 2015-04-11 NOTE — Progress Notes (Signed)
   Subjective:    Patient ID: Christopher Bradshaw, male    DOB: April 10, 1946, 69 y.o.   MRN: [id number]  HPI   His symptoms began 3 weeks ago as sneezing and itchy/ watery eyes. He typically has allergies from April through mid-June each year. As of 4/23-24 he's developed a paroxysmal cough productive of green/yellow sputum. He has frontal sinus pressure without pain. He's had a lesser volume of nasal discharge. He saw a local physician and was given Kenalog injection without significant improvement. There has been only partial response to Mucinex as well as Claritin.  His blood sugars have been ranging 120-125.    Review of Systems He denies facial pain, otic pain, otic discharge, fever, chills, sweats, wheezing, shortness of breath.    Objective:   Physical Exam  General appearance:Adequately nourished; no acute distress or increased work of breathing is present.    Lymphatic: No  lymphadenopathy about the head, neck, or axilla .  Eyes: No conjunctival inflammation or lid edema is present. There is no scleral icterus.  Ears:  External ear exam shows no significant lesions or deformities.  Otoscopic examination reveals clear canals, tympanic membranes are intact bilaterally without bulging, retraction, inflammation or discharge.  Nose:  External nasal examination shows no deformity or inflammation. Marked erythema of the nasal septum. No septal dislocation or deviation.No obstruction to airflow. Hyponasal speech pattern  Oral exam: Dental hygiene is good; lips and gums are healthy appearing.There is no oropharyngeal erythema or exudate .  Neck:  No deformities, thyromegaly, masses, or tenderness noted.   Supple with full range of motion without pain.   Heart:  Normal rate and regular rhythm. S1 and S2 normal without gallop, murmur, click, rub or other extra sounds.   Lungs:Chest clear to auscultation; no wheezes, rhonchi,rales ,or rubs present. Harsh paroxysms of cough  intermittently.  Extremities:  No cyanosis, edema, or clubbing  noted    Skin: Warm & dry w/o tenting or jaundice. No significant lesions or rash.       Assessment & Plan:  #1 acute bronchitis w/o bronchospasm #2allergic rhinitis #3 DM Plan: See orders and recommendations

## 2015-04-11 NOTE — Progress Notes (Signed)
Pre visit review using our clinic review tool, if applicable. No additional management support is needed unless otherwise documented below in the visit note. 

## 2015-04-12 ENCOUNTER — Other Ambulatory Visit: Payer: Self-pay | Admitting: Internal Medicine

## 2015-04-12 DIAGNOSIS — E1151 Type 2 diabetes mellitus with diabetic peripheral angiopathy without gangrene: Secondary | ICD-10-CM

## 2015-04-12 DIAGNOSIS — E782 Mixed hyperlipidemia: Secondary | ICD-10-CM

## 2015-04-12 DIAGNOSIS — I10 Essential (primary) hypertension: Secondary | ICD-10-CM

## 2015-04-12 MED ORDER — METFORMIN HCL 500 MG PO TABS: 500.0000 mg | ORAL_TABLET | Freq: Two times a day (BID) | ORAL | Status: DC

## 2015-04-12 NOTE — Addendum Note (Signed)
Addended by: Roma Schanz R on: 04/12/2015 08:04 AM   Modules accepted: Orders

## 2015-05-27 ENCOUNTER — Encounter: Payer: Self-pay | Admitting: Internal Medicine

## 2015-06-13 ENCOUNTER — Other Ambulatory Visit: Payer: Self-pay

## 2015-07-05 DIAGNOSIS — N390 Urinary tract infection, site not specified: Secondary | ICD-10-CM | POA: Diagnosis not present

## 2015-07-05 DIAGNOSIS — R310 Gross hematuria: Secondary | ICD-10-CM | POA: Diagnosis not present

## 2015-07-14 DIAGNOSIS — R310 Gross hematuria: Secondary | ICD-10-CM | POA: Diagnosis not present

## 2015-07-14 DIAGNOSIS — N400 Enlarged prostate without lower urinary tract symptoms: Secondary | ICD-10-CM | POA: Diagnosis not present

## 2015-07-14 DIAGNOSIS — N200 Calculus of kidney: Secondary | ICD-10-CM | POA: Diagnosis not present

## 2015-08-05 DIAGNOSIS — R312 Other microscopic hematuria: Secondary | ICD-10-CM | POA: Diagnosis not present

## 2015-08-05 DIAGNOSIS — R3916 Straining to void: Secondary | ICD-10-CM | POA: Diagnosis not present

## 2015-08-05 DIAGNOSIS — R300 Dysuria: Secondary | ICD-10-CM | POA: Diagnosis not present

## 2015-08-24 DIAGNOSIS — R312 Other microscopic hematuria: Secondary | ICD-10-CM | POA: Diagnosis not present

## 2015-08-24 DIAGNOSIS — N400 Enlarged prostate without lower urinary tract symptoms: Secondary | ICD-10-CM | POA: Diagnosis not present

## 2015-09-22 DIAGNOSIS — D3131 Benign neoplasm of right choroid: Secondary | ICD-10-CM | POA: Diagnosis not present

## 2015-09-22 DIAGNOSIS — H2513 Age-related nuclear cataract, bilateral: Secondary | ICD-10-CM | POA: Diagnosis not present

## 2015-09-22 DIAGNOSIS — E119 Type 2 diabetes mellitus without complications: Secondary | ICD-10-CM | POA: Diagnosis not present

## 2015-10-20 ENCOUNTER — Encounter: Payer: Self-pay | Admitting: Internal Medicine

## 2015-10-20 ENCOUNTER — Other Ambulatory Visit (INDEPENDENT_AMBULATORY_CARE_PROVIDER_SITE_OTHER): Payer: Medicare Other

## 2015-10-20 ENCOUNTER — Ambulatory Visit (INDEPENDENT_AMBULATORY_CARE_PROVIDER_SITE_OTHER): Payer: Medicare Other | Admitting: Internal Medicine

## 2015-10-20 VITALS — BP 132/72 | HR 75 | Temp 98.3°F | Resp 14 | Ht 72.0 in | Wt 177.0 lb

## 2015-10-20 DIAGNOSIS — I10 Essential (primary) hypertension: Secondary | ICD-10-CM

## 2015-10-20 DIAGNOSIS — E782 Mixed hyperlipidemia: Secondary | ICD-10-CM | POA: Diagnosis not present

## 2015-10-20 DIAGNOSIS — Z23 Encounter for immunization: Secondary | ICD-10-CM

## 2015-10-20 DIAGNOSIS — Z0000 Encounter for general adult medical examination without abnormal findings: Secondary | ICD-10-CM

## 2015-10-20 DIAGNOSIS — E1151 Type 2 diabetes mellitus with diabetic peripheral angiopathy without gangrene: Secondary | ICD-10-CM

## 2015-10-20 LAB — LIPID PANEL
CHOLESTEROL: 157 mg/dL (ref 0–200)
HDL: 55.8 mg/dL (ref >39.00)
LDL Cholesterol: 87 mg/dL (ref 0–99)
NONHDL: 100.83
Total CHOL/HDL Ratio: 3
Triglycerides: 67 mg/dL (ref 0.0–149.0)
VLDL: 13.4 mg/dL (ref 0.0–40.0)

## 2015-10-20 LAB — COMPREHENSIVE METABOLIC PANEL
ALBUMIN: 4.3 g/dL (ref 3.5–5.2)
ALT: 19 U/L (ref 0–53)
AST: 20 U/L (ref 0–37)
Alkaline Phosphatase: 49 U/L (ref 39–117)
BILIRUBIN TOTAL: 0.9 mg/dL (ref 0.2–1.2)
BUN: 18 mg/dL (ref 6–23)
CALCIUM: 9.5 mg/dL (ref 8.4–10.5)
CO2: 27 mEq/L (ref 19–32)
CREATININE: 0.87 mg/dL (ref 0.40–1.50)
Chloride: 106 mEq/L (ref 96–112)
GFR: 91.76 mL/min (ref >60.00)
Glucose, Bld: 117 mg/dL — ABNORMAL HIGH (ref 70–99)
Potassium: 4.3 mEq/L (ref 3.5–5.1)
Sodium: 139 mEq/L (ref 135–145)
TOTAL PROTEIN: 6.6 g/dL (ref 6.0–8.3)

## 2015-10-20 LAB — HEMOGLOBIN A1C: Hgb A1c MFr Bld: 7.6 % — ABNORMAL HIGH (ref 4.6–6.5)

## 2015-10-20 MED ORDER — FREESTYLE LANCETS MISC: Status: DC

## 2015-10-20 MED ORDER — BENAZEPRIL HCL 40 MG PO TABS: 40.0000 mg | ORAL_TABLET | Freq: Every day | ORAL | Status: DC

## 2015-10-20 MED ORDER — SIMVASTATIN 40 MG PO TABS: 40.0000 mg | ORAL_TABLET | Freq: Every day | ORAL | Status: DC

## 2015-10-20 MED ORDER — GLUCOSE BLOOD VI STRP: ORAL_STRIP | Status: DC

## 2015-10-20 NOTE — Progress Notes (Signed)
Pre visit review using our clinic review tool, if applicable. No additional management support is needed unless otherwise documented below in the visit note. 

## 2015-10-20 NOTE — Patient Instructions (Signed)
We have given you the flu shot and the pneumonia booster shot (prevnar).   We will check the blood work today and call you back about the results.   Diabetes and Exercise Exercising regularly is important. It is not just about losing weight. It has many health benefits, such as:  Improving your overall fitness, flexibility, and endurance.  Increasing your bone density.  Helping with weight control.  Decreasing your body fat.  Increasing your muscle strength.  Reducing stress and tension.  Improving your overall health. People with diabetes who exercise gain additional benefits because exercise:  Reduces appetite.  Improves the body's use of blood sugar (glucose).  Helps lower or control blood glucose.  Decreases blood pressure.  Helps control blood lipids (such as cholesterol and triglycerides).  Improves the body's use of the hormone insulin by:  Increasing the body's insulin sensitivity.  Reducing the body's insulin needs.  Decreases the risk for heart disease because exercising:  Lowers cholesterol and triglycerides levels.  Increases the levels of good cholesterol (such as high-density lipoproteins [HDL]) in the body.  Lowers blood glucose levels. YOUR ACTIVITY PLAN  Choose an activity that you enjoy, and set realistic goals. To exercise safely, you should begin practicing any new physical activity slowly, and gradually increase the intensity of the exercise over time. Your health care provider or diabetes educator can help create an activity plan that works for you. General recommendations include:  Encouraging children to engage in at least 60 minutes of physical activity each day.  Stretching and performing strength training exercises, such as yoga or weight lifting, at least 2 times per week.  Performing a total of at least 150 minutes of moderate-intensity exercise each week, such as brisk walking or water aerobics.  Exercising at least 3 days per week,  making sure you allow no more than 2 consecutive days to pass without exercising.  Avoiding long periods of inactivity (90 minutes or more). When you have to spend an extended period of time sitting down, take frequent breaks to walk or stretch. RECOMMENDATIONS FOR EXERCISING WITH TYPE 1 OR TYPE 2 DIABETES   Check your blood glucose before exercising. If blood glucose levels are greater than 240 mg/dL, check for urine ketones. Do not exercise if ketones are present.  Avoid injecting insulin into areas of the body that are going to be exercised. For example, avoid injecting insulin into:  The arms when playing tennis.  The legs when jogging.  Keep a record of:  Food intake before and after you exercise.  Expected peak times of insulin action.  Blood glucose levels before and after you exercise.  The type and amount of exercise you have done.  Review your records with your health care provider. Your health care provider will help you to develop guidelines for adjusting food intake and insulin amounts before and after exercising.  If you take insulin or oral hypoglycemic agents, watch for signs and symptoms of hypoglycemia. They include:  Dizziness.  Shaking.  Sweating.  Chills.  Confusion.  Drink plenty of water while you exercise to prevent dehydration or heat stroke. Body water is lost during exercise and must be replaced.  Talk to your health care provider before starting an exercise program to make sure it is safe for you. Remember, almost any type of activity is better than none.   This information is not intended to replace advice given to you by your health care provider. Make sure you discuss any questions you have  with your health care provider.   Document Released: 02/23/2004 Document Revised: 04/19/2015 Document Reviewed: 05/12/2013 Elsevier Interactive Patient Education [employer].

## 2015-10-21 DIAGNOSIS — Z23 Encounter for immunization: Secondary | ICD-10-CM

## 2015-10-23 ENCOUNTER — Encounter: Payer: Self-pay | Admitting: Internal Medicine

## 2015-10-23 DIAGNOSIS — Z0000 Encounter for general adult medical examination without abnormal findings: Secondary | ICD-10-CM | POA: Insufficient documentation

## 2015-10-23 NOTE — Assessment & Plan Note (Signed)
Checking labs, given 10 year screening recommendations. Given flu and prevnar shots at visit to update immunizations. Colonoscopy up to date. Counseled about exercise daily to help with overall health.

## 2015-10-23 NOTE — Progress Notes (Signed)
   Subjective:    Patient ID: Christopher Bradshaw, male    DOB: Apr 22, 1946, 69 y.o.   MRN: [id number]  HPI Here for medicare wellness, no new complaints. Please see A/P for status and treatment of chronic medical problems.   Diet: heart healthy Physical activity: sedentary Depression/mood screen: negative Hearing: intact to whispered voice Visual acuity: grossly normal, performs annual eye exam  ADLs: capable Fall risk: none Home safety: good Cognitive evaluation: intact to orientation, naming, recall and repetition EOL planning: adv directives discussed  I have personally reviewed and have noted 1. The patient's medical and social history - reviewed today no changes 2. Their use of alcohol, tobacco or illicit drugs 3. Their current medications and supplements 4. The patient's functional ability including ADL's, fall risks, home safety risks and hearing or visual impairment. 5. Diet and physical activities 6. Evidence for depression or mood disorders 7. Care team reviewed and updated (available in snapshot)  Review of Systems  Constitutional: Negative for fever, activity change, appetite change, fatigue and unexpected weight change.  HENT: Negative.   Eyes: Negative.   Respiratory: Negative.   Cardiovascular: Negative for chest pain, palpitations and leg swelling.  Gastrointestinal: Negative.   Musculoskeletal: Negative.   Skin: Negative.   Neurological: Negative.   Psychiatric/Behavioral: Negative.       Objective:   Physical Exam  Constitutional: He is oriented to person, place, and time. He appears well-developed and well-nourished.  HENT:  Head: Normocephalic and atraumatic.  Eyes: EOM are normal.  Neck: Normal range of motion.  Cardiovascular: Normal rate and regular rhythm.   Pulmonary/Chest: Effort normal and breath sounds normal. No respiratory distress. He has no wheezes. He has no rales.  Abdominal: Soft. Bowel sounds are normal. He exhibits no distension. There is  no tenderness.  Musculoskeletal: He exhibits no edema.  Neurological: He is alert and oriented to person, place, and time. Coordination normal.  Skin: Skin is warm and dry.  Psychiatric: He has a normal mood and affect.    Filed Vitals:   10/20/15 1353  BP: 132/72  Pulse: 75  Temp: 98.3 F (36.8 C)  TempSrc: Oral  Resp: 14  Height: 6' (1.829 m)  Weight: 177 lb (80.287 kg)  SpO2: 97%      Assessment & Plan:  Flu shot and prevnar 13 given at visit.

## 2015-11-17 DIAGNOSIS — N400 Enlarged prostate without lower urinary tract symptoms: Secondary | ICD-10-CM | POA: Diagnosis not present

## 2015-11-23 DIAGNOSIS — N400 Enlarged prostate without lower urinary tract symptoms: Secondary | ICD-10-CM | POA: Diagnosis not present

## 2015-11-23 DIAGNOSIS — R3129 Other microscopic hematuria: Secondary | ICD-10-CM | POA: Diagnosis not present

## 2015-11-23 DIAGNOSIS — N5201 Erectile dysfunction due to arterial insufficiency: Secondary | ICD-10-CM | POA: Diagnosis not present

## 2016-03-20 DIAGNOSIS — Z6822 Body mass index (BMI) 22.0-22.9, adult: Secondary | ICD-10-CM | POA: Diagnosis not present

## 2016-03-20 DIAGNOSIS — J309 Allergic rhinitis, unspecified: Secondary | ICD-10-CM | POA: Diagnosis not present

## 2016-04-03 ENCOUNTER — Ambulatory Visit (INDEPENDENT_AMBULATORY_CARE_PROVIDER_SITE_OTHER): Payer: Medicare Other | Admitting: Internal Medicine

## 2016-04-03 ENCOUNTER — Other Ambulatory Visit (INDEPENDENT_AMBULATORY_CARE_PROVIDER_SITE_OTHER): Payer: Medicare Other

## 2016-04-03 ENCOUNTER — Encounter: Payer: Self-pay | Admitting: Internal Medicine

## 2016-04-03 VITALS — BP 124/86 | HR 85 | Temp 98.4°F | Resp 16 | Ht 72.0 in | Wt 154.0 lb

## 2016-04-03 DIAGNOSIS — E119 Type 2 diabetes mellitus without complications: Secondary | ICD-10-CM

## 2016-04-03 DIAGNOSIS — R634 Abnormal weight loss: Secondary | ICD-10-CM

## 2016-04-03 DIAGNOSIS — E1151 Type 2 diabetes mellitus with diabetic peripheral angiopathy without gangrene: Secondary | ICD-10-CM | POA: Diagnosis not present

## 2016-04-03 LAB — COMPREHENSIVE METABOLIC PANEL
ALT: 21 U/L (ref 0–53)
AST: 17 U/L (ref 0–37)
Albumin: 4.5 g/dL (ref 3.5–5.2)
Alkaline Phosphatase: 58 U/L (ref 39–117)
BILIRUBIN TOTAL: 1.2 mg/dL (ref 0.2–1.2)
BUN: 21 mg/dL (ref 6–23)
CO2: 28 meq/L (ref 19–32)
CREATININE: 1.04 mg/dL (ref 0.40–1.50)
Calcium: 10.4 mg/dL (ref 8.4–10.5)
Chloride: 99 mEq/L (ref 96–112)
GFR: 74.59 mL/min (ref >60.00)
GLUCOSE: 311 mg/dL — AB (ref 70–99)
Potassium: 6 mEq/L — ABNORMAL HIGH (ref 3.5–5.1)
Sodium: 134 mEq/L — ABNORMAL LOW (ref 135–145)
TOTAL PROTEIN: 7 g/dL (ref 6.0–8.3)

## 2016-04-03 LAB — HEMOGLOBIN A1C: Hgb A1c MFr Bld: 13.4 % — ABNORMAL HIGH (ref 4.6–6.5)

## 2016-04-03 LAB — CBC
HEMATOCRIT: 48.9 % (ref 39.0–52.0)
HEMOGLOBIN: 16.9 g/dL (ref 13.0–17.0)
MCHC: 34.4 g/dL (ref 30.0–36.0)
MCV: 90.5 fl (ref 78.0–100.0)
Platelets: 123 10*3/uL — ABNORMAL LOW (ref 150.0–400.0)
RBC: 5.41 Mil/uL (ref 4.22–5.81)
RDW: 12.9 % (ref 11.5–15.5)
WBC: 7.8 10*3/uL (ref 4.0–10.5)

## 2016-04-03 LAB — TSH: TSH: 3.82 u[IU]/mL (ref 0.35–4.50)

## 2016-04-03 NOTE — Assessment & Plan Note (Signed)
It is likely that the weight loss is due to the uncontrolled diabetes. If that is not the case we would need to look further into the weight loss. He is up to date on colonoscopy and no symptoms to suggest etiology. Checking TSH, CBC, CMP, HgA1c today.

## 2016-04-03 NOTE — Progress Notes (Signed)
   Subjective:    Patient ID: Christopher Bradshaw, male    DOB: 1946-10-24, 70 y.o.   MRN: [id number]  HPI The patient is a 70 YO man coming in for flare of his diabetes. He has been controlling his sugars with diet and exercise for some time now. He has been a little lax on the diet but is still exercising. He did get kenalog shot end of March to help prevent his allergies. He has noticed sugars around 230-290 recently which is concerning him. He has lost about 20 pounds since last visit and is very thirsty most of the time and urinating excessively. Waking up about every hour at night time. Denies any cold or illness during that time. Denies chest pains or pressure, cough, fevers, chills, new pain. Denies new stomach symptoms such as diarrhea, constipation, blood in stool. He is up to date on colonoscopy.    Review of Systems  Constitutional: Positive for fatigue. Negative for fever, activity change, appetite change and unexpected weight change.  HENT: Negative.   Eyes: Negative.   Respiratory: Negative.   Cardiovascular: Negative for chest pain, palpitations and leg swelling.  Gastrointestinal: Negative.   Endocrine: Positive for polydipsia and polyuria. Negative for cold intolerance and heat intolerance.  Musculoskeletal: Negative.   Skin: Negative.   Neurological: Negative.   Psychiatric/Behavioral: Negative.       Objective:   Physical Exam  Constitutional: He is oriented to person, place, and time. He appears well-developed and well-nourished.  HENT:  Head: Normocephalic and atraumatic.  Eyes: EOM are normal.  Neck: Normal range of motion.  Cardiovascular: Normal rate and regular rhythm.   Pulmonary/Chest: Effort normal and breath sounds normal. No respiratory distress. He has no wheezes. He has no rales.  Abdominal: Soft. Bowel sounds are normal. He exhibits no distension. There is no tenderness.  Musculoskeletal: He exhibits no edema.  Lymphadenopathy:    He has no cervical  adenopathy.  Neurological: He is alert and oriented to person, place, and time. Coordination normal.  Skin: Skin is warm and dry.  See foot exam  Psychiatric: He has a normal mood and affect.   Filed Vitals:   04/03/16 0813  BP: 124/86  Pulse: 85  Temp: 98.4 F (36.9 C)  TempSrc: Oral  Resp: 16  Height: 6' (1.829 m)  Weight: 154 lb (69.854 kg)  SpO2: 99%      Assessment & Plan:

## 2016-04-03 NOTE — Progress Notes (Signed)
Pre visit review using our clinic review tool, if applicable. No additional management support is needed unless otherwise documented below in the visit note. 

## 2016-04-03 NOTE — Patient Instructions (Signed)
We are checking the labs today and will call you back with the results.  We will likely send in a medicine for the sugars that you will take once a day called metformin. This helps to let your body use the insulin it makes normally better so the sugars come down.   Keep up the good work with the diet and the exercise as well and make sure to keep drinking extra water while the sugars come down.

## 2016-04-03 NOTE — Assessment & Plan Note (Signed)
Will likely need therapy at this time. He is having increased thirst and urination. Checking HgA1c and will likely add metformin to help. Kidney function normal. On ACE-I and statin. Foot exam done today. It is likely that the weight loss is due to the uncontrolled diabetes. If that is not the case we would need to look further into the weight loss.

## 2016-04-05 ENCOUNTER — Other Ambulatory Visit: Payer: Self-pay | Admitting: Internal Medicine

## 2016-04-05 DIAGNOSIS — E875 Hyperkalemia: Secondary | ICD-10-CM

## 2016-04-05 MED ORDER — METFORMIN HCL 500 MG PO TABS: 500.0000 mg | ORAL_TABLET | Freq: Two times a day (BID) | ORAL | Status: DC

## 2016-04-09 ENCOUNTER — Other Ambulatory Visit (INDEPENDENT_AMBULATORY_CARE_PROVIDER_SITE_OTHER): Payer: Medicare Other

## 2016-04-09 DIAGNOSIS — E875 Hyperkalemia: Secondary | ICD-10-CM | POA: Diagnosis not present

## 2016-04-09 LAB — BASIC METABOLIC PANEL
BUN: 17 mg/dL (ref 6–23)
CHLORIDE: 101 meq/L (ref 96–112)
CO2: 29 meq/L (ref 19–32)
Calcium: 9.8 mg/dL (ref 8.4–10.5)
Creatinine, Ser: 0.93 mg/dL (ref 0.40–1.50)
GFR: 84.85 mL/min (ref >60.00)
GLUCOSE: 273 mg/dL — AB (ref 70–99)
POTASSIUM: 5 meq/L (ref 3.5–5.1)
SODIUM: 136 meq/L (ref 135–145)

## 2016-04-19 ENCOUNTER — Telehealth: Payer: Self-pay | Admitting: Internal Medicine

## 2016-04-19 NOTE — Telephone Encounter (Signed)
Patient states he had a vm left today stating to call back b/c there was an important message to give him. I did not see phone note in system.  Did you call patient.

## 2016-04-20 NOTE — Telephone Encounter (Signed)
No, I didn't call this patient.

## 2016-05-15 IMAGING — CR DG CHEST 2V
2 series · 2 of 2 positions shown · non-contrast
Comparison: 05/29/2014.  CT 05/29/2014.

CLINICAL DATA: Cough and congestion.

EXAM:
CHEST  2 VIEW

[view not recorded (1 of 2)]
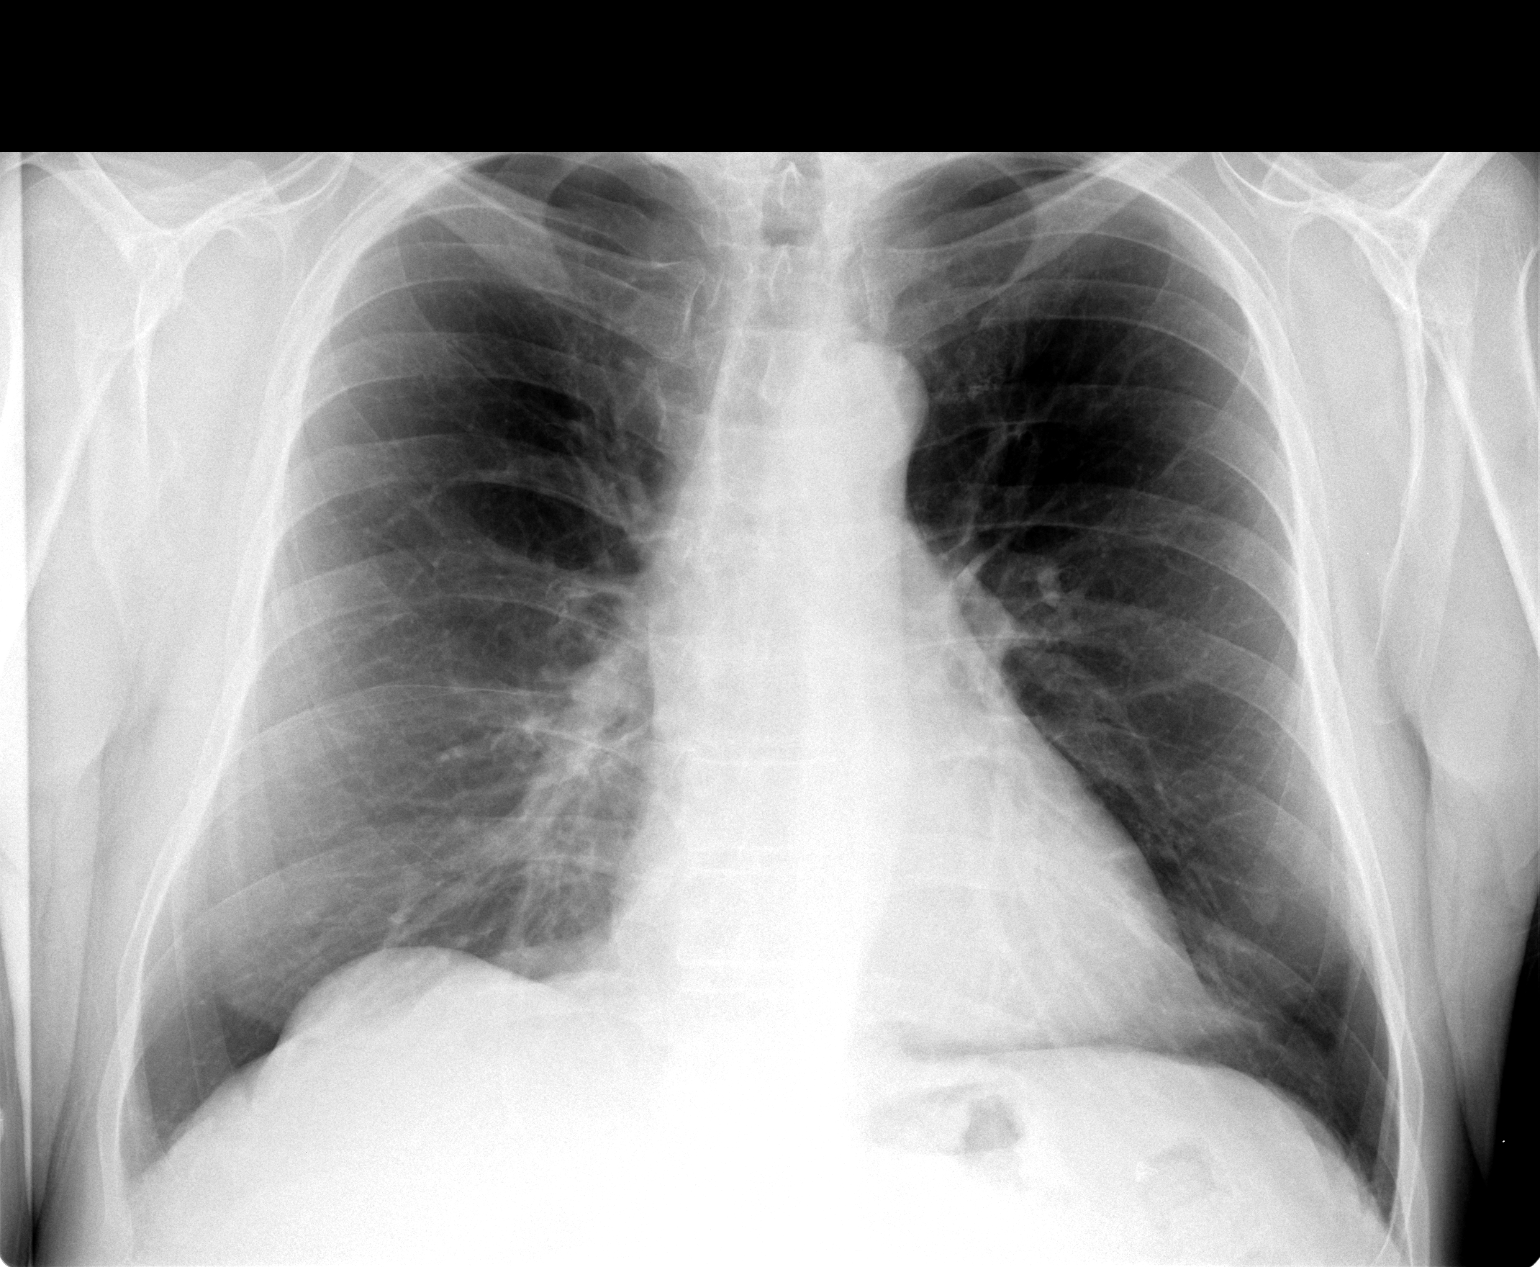

[view not recorded (2 of 2)]
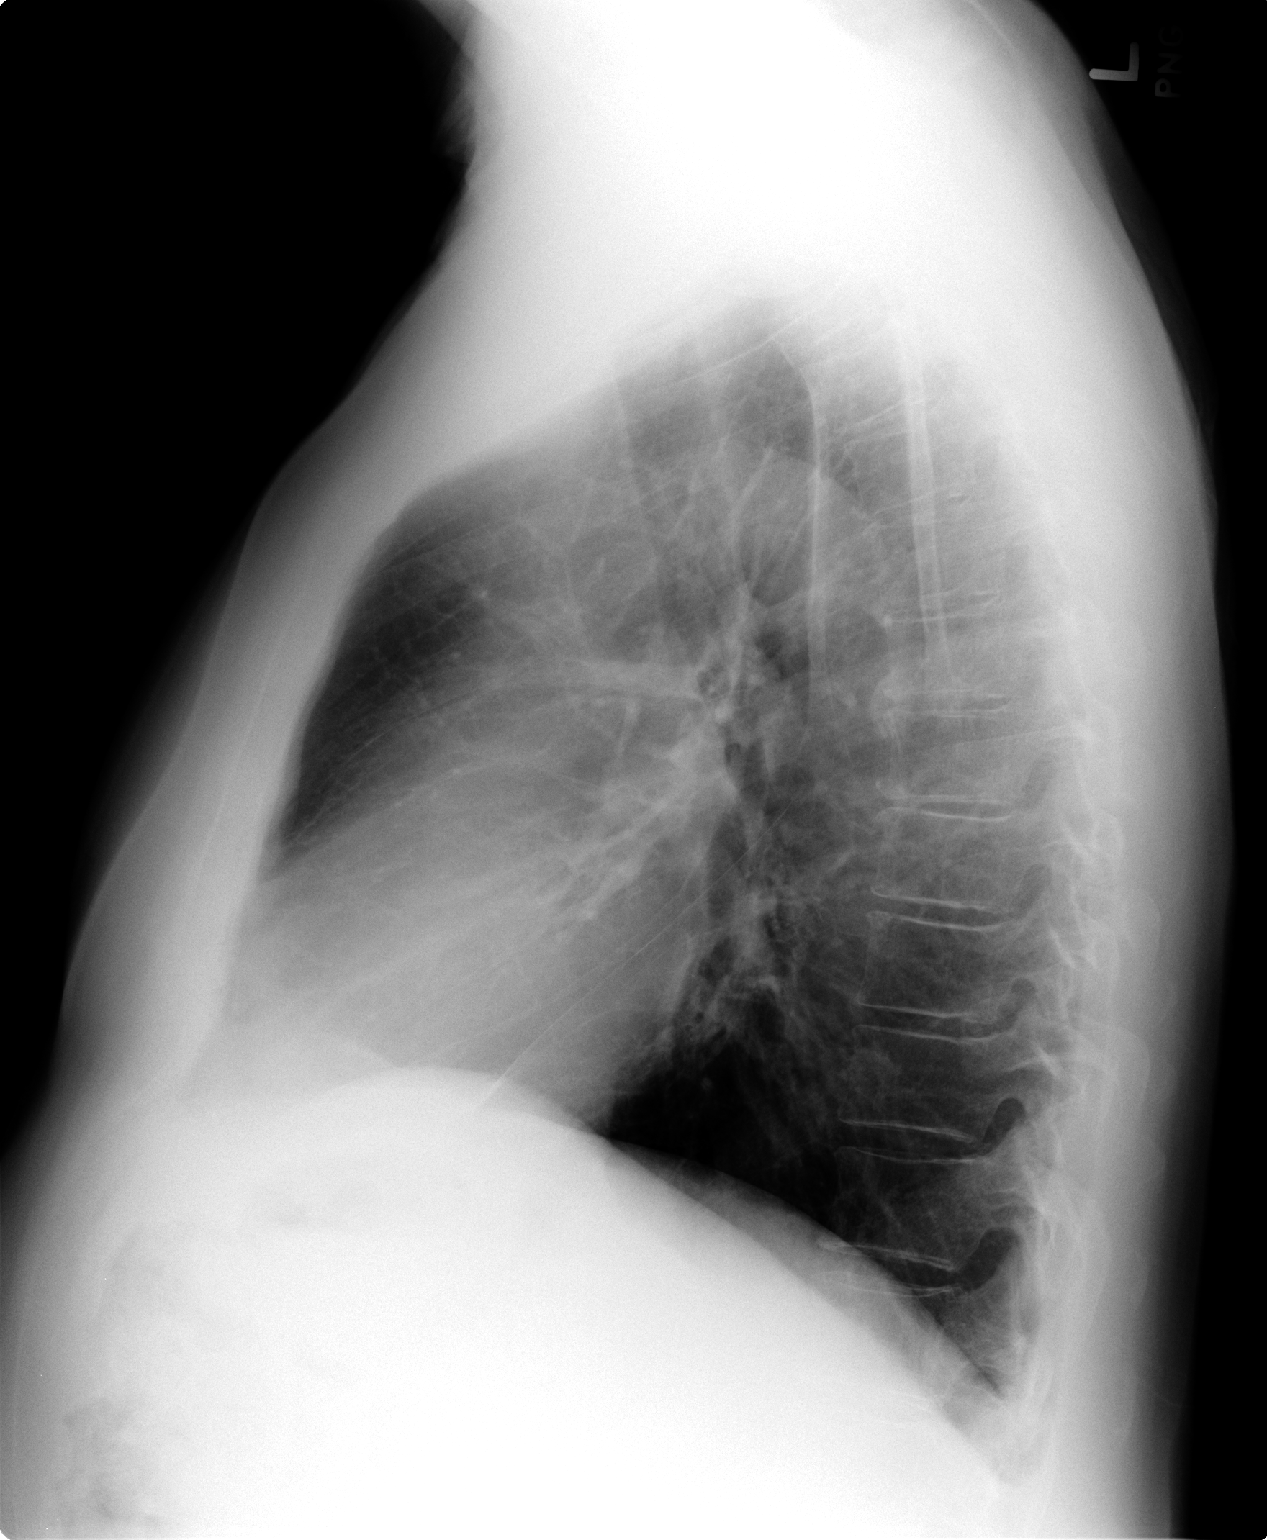

[2 of 2 positions shown; findings below may reference images not displayed]

FINDINGS: Mediastinum and hilar structures are normal. Faint solitary nodular
opacities noted over both lung bases on PA view only. These are
consistent with nipple shadows. Lungs are clear. No pleural effusion
or pneumothorax. Cardiomegaly. No pulmonary venous congestion. No
acute bony abnormality.
IMPRESSION: 1. Stable cardiomegaly.
2. No acute cardiopulmonary disease.

## 2016-05-30 DIAGNOSIS — N481 Balanitis: Secondary | ICD-10-CM | POA: Diagnosis not present

## 2016-05-30 DIAGNOSIS — R3121 Asymptomatic microscopic hematuria: Secondary | ICD-10-CM | POA: Diagnosis not present

## 2016-05-30 DIAGNOSIS — N400 Enlarged prostate without lower urinary tract symptoms: Secondary | ICD-10-CM | POA: Diagnosis not present

## 2016-05-30 DIAGNOSIS — N5201 Erectile dysfunction due to arterial insufficiency: Secondary | ICD-10-CM | POA: Diagnosis not present

## 2016-07-03 ENCOUNTER — Other Ambulatory Visit (INDEPENDENT_AMBULATORY_CARE_PROVIDER_SITE_OTHER): Payer: Medicare Other

## 2016-07-03 ENCOUNTER — Encounter: Payer: Self-pay | Admitting: Internal Medicine

## 2016-07-03 ENCOUNTER — Ambulatory Visit (INDEPENDENT_AMBULATORY_CARE_PROVIDER_SITE_OTHER): Payer: Medicare Other | Admitting: Internal Medicine

## 2016-07-03 VITALS — BP 110/58 | HR 73 | Temp 97.7°F | Resp 12 | Ht 72.0 in | Wt 152.0 lb

## 2016-07-03 DIAGNOSIS — E1151 Type 2 diabetes mellitus with diabetic peripheral angiopathy without gangrene: Secondary | ICD-10-CM | POA: Diagnosis not present

## 2016-07-03 LAB — HEMOGLOBIN A1C: Hgb A1c MFr Bld: 6.7 % — ABNORMAL HIGH (ref 4.6–6.5)

## 2016-07-03 MED ORDER — FREESTYLE LANCETS MISC: Status: DC

## 2016-07-03 MED ORDER — GLUCOSE BLOOD VI STRP: ORAL_STRIP | Status: DC

## 2016-07-03 NOTE — Progress Notes (Signed)
   Subjective:    Patient ID: Christopher Bradshaw, male    DOB: [birth year], 70 y.o.   MRN: [id number]  HPI The patient is a 70 YO man coming in for follow up of his diabetes. He started taking metformin as we suggested. Trying to do better with his diet and exercise. He is still a little worried that he is not able to gain back some of the weight he lost when his sugars were very high. He is not having any weakness or fatigue. No nausea or vomiting. No diarrhea or constipation. Weight is stable from last time. Sugars running average 112 in the mornings.   Review of Systems  Constitutional: Negative for fever, activity change, appetite change, fatigue and unexpected weight change.  Respiratory: Negative.   Cardiovascular: Negative for chest pain, palpitations and leg swelling.  Gastrointestinal: Negative.   Endocrine: Negative for cold intolerance, heat intolerance, polydipsia and polyuria.  Musculoskeletal: Negative.   Skin: Negative.   Neurological: Negative.   Psychiatric/Behavioral: Negative.       Objective:   Physical Exam  Constitutional: He is oriented to person, place, and time. He appears well-developed and well-nourished.  HENT:  Head: Normocephalic and atraumatic.  Eyes: EOM are normal.  Neck: Normal range of motion.  Cardiovascular: Normal rate and regular rhythm.   Pulmonary/Chest: Effort normal and breath sounds normal. No respiratory distress. He has no wheezes. He has no rales.  Abdominal: Soft. Bowel sounds are normal. He exhibits no distension. There is no tenderness.  Musculoskeletal: He exhibits no edema.  Lymphadenopathy:    He has no cervical adenopathy.  Neurological: He is alert and oriented to person, place, and time. Coordination normal.  Skin: Skin is warm and dry.   Filed Vitals:   07/03/16 0817  BP: 110/58  Pulse: 73  Temp: 97.7 F (36.5 C)  TempSrc: Oral  Resp: 12  Height: 6' (1.829 m)  Weight: 152 lb (68.947 kg)  SpO2: 98%      Assessment & Plan:

## 2016-07-03 NOTE — Patient Instructions (Signed)
We are checking the blood work today and if the sugars are doing well we can decrease the metformin to once a day and will send the results to the mychart.

## 2016-07-03 NOTE — Assessment & Plan Note (Signed)
Checking HgA1c, fasting sugars are around 112. If HgA1c is better will decrease metformin to daily only to help him put some of the weight loss back on as his BMI is low now. Foot exam up to date. Getting eye exam in August. On ACE-I and statin.

## 2016-07-03 NOTE — Progress Notes (Signed)
Pre visit review using our clinic review tool, if applicable. No additional management support is needed unless otherwise documented below in the visit note. 

## 2016-08-25 DIAGNOSIS — S299XXA Unspecified injury of thorax, initial encounter: Secondary | ICD-10-CM | POA: Diagnosis not present

## 2016-08-25 DIAGNOSIS — R0789 Other chest pain: Secondary | ICD-10-CM | POA: Diagnosis not present

## 2016-08-25 DIAGNOSIS — T7411XA Adult physical abuse, confirmed, initial encounter: Secondary | ICD-10-CM | POA: Diagnosis not present

## 2016-08-25 DIAGNOSIS — M542 Cervicalgia: Secondary | ICD-10-CM | POA: Diagnosis not present

## 2016-08-31 ENCOUNTER — Encounter: Payer: Self-pay | Admitting: Internal Medicine

## 2016-08-31 ENCOUNTER — Ambulatory Visit (INDEPENDENT_AMBULATORY_CARE_PROVIDER_SITE_OTHER): Payer: Medicare Other | Admitting: Internal Medicine

## 2016-08-31 VITALS — BP 130/70 | HR 74 | Temp 97.8°F | Ht 72.0 in | Wt 161.0 lb

## 2016-08-31 DIAGNOSIS — Z23 Encounter for immunization: Secondary | ICD-10-CM

## 2016-08-31 DIAGNOSIS — I10 Essential (primary) hypertension: Secondary | ICD-10-CM | POA: Diagnosis not present
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
## 2016-08-31 NOTE — Progress Notes (Signed)
Pre visit review using our clinic review tool, if applicable. No additional management support is needed unless otherwise documented below in the visit note. 

## 2016-09-02 NOTE — Assessment & Plan Note (Signed)
BP at goal, no change to medications today.
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
## 2016-10-16 ENCOUNTER — Ambulatory Visit (INDEPENDENT_AMBULATORY_CARE_PROVIDER_SITE_OTHER): Payer: Medicare Other | Admitting: Family

## 2016-10-16 ENCOUNTER — Encounter: Payer: Self-pay | Admitting: Family

## 2016-10-16 DIAGNOSIS — J0100 Acute maxillary sinusitis, unspecified: Secondary | ICD-10-CM | POA: Diagnosis not present

## 2016-10-16 MED ORDER — HYDROCOD POLST-CPM POLST ER 10-8 MG/5ML PO SUER: 5.0000 mL | Freq: Every evening | ORAL | 0 refills | Status: DC | PRN

## 2016-10-16 MED ORDER — AMOXICILLIN-POT CLAVULANATE 875-125 MG PO TABS: 1.0000 | ORAL_TABLET | Freq: Two times a day (BID) | ORAL | 0 refills | Status: DC

## 2016-10-16 NOTE — Progress Notes (Signed)
Subjective:    Patient ID: Christopher Bradshaw, male    DOB: Aug 07, 1946, 70 y.o.   MRN: [id number]  Chief Complaint  Patient presents with  . Sore Throat    productive cough, sore throat, left ear feels funny, x4 days    HPI:  Christopher Bradshaw is a 70 y.o. male who  has a past medical history of Allergic rhinitis; BPH (benign prostatic hypertrophy); Diabetes mellitus without complication ([facility]); Femoral bruit; Rosanna Randy syndrome; Hyperglycemia; Hyperlipidemia; Hypertension; Nephrolithiasis; RBBB (right bundle branch block); Thrombophlebitis (09/2004); and Varicose vein. and presents today for an acute office visit.   This is a new problem. Associated symptom of productive cough, sore throat and left ears feeling funny have been going on for about 4 days. Denies any fevers. Describes he has a hard time to get his throat to clear. Modifying factors include Mucinex and sucrets which did not seem to help very much. Severity of the symptoms have disturbed his sleep pattern.   No Known Allergies    Outpatient Medications Prior to Visit  Medication Sig Dispense Refill  . aspirin 81 MG tablet Take 81 mg by mouth daily.      . benazepril (LOTENSIN) 40 MG tablet Take 1 tablet (40 mg total) by mouth daily. 90 tablet 3  . glucose blood (FREESTYLE LITE) test strip USE TO TEST BLOOD SUGAR ONCE DAILY ICD E11 59 100 each 11  . Lancets (FREESTYLE) lancets USE TO CHECK BLOOD SUGAR ONCE DAILY ICD E11 59 100 each 11  . metFORMIN (GLUCOPHAGE) 500 MG tablet Take 1 tablet (500 mg total) by mouth 2 (two) times daily with a meal. 180 tablet 3  . Omega-3 Fatty Acids (FISH OIL) 1000 MG CAPS Take by mouth.      . simvastatin (ZOCOR) 40 MG tablet Take 1 tablet (40 mg total) by mouth at bedtime. 90 tablet 3  . vitamin E 100 UNIT capsule Take 400 Units by mouth daily.      No facility-administered medications prior to visit.      Review of Systems  Constitutional: Negative for chills and fever.  HENT: Positive for  congestion, sinus pressure and sore throat.   Respiratory: Positive for cough. Negative for chest tightness and shortness of breath.   Neurological: Positive for headaches.      Objective:    BP 120/74 (BP Location: Left Arm, Patient Position: Sitting, Cuff Size: Normal)   Pulse 74   Temp 98.1 F (36.7 C) (Oral)   Resp 16   Ht 6' (1.829 m)   Wt 164 lb (74.4 kg)   SpO2 97%   BMI 22.24 kg/m  Nursing note and vital signs reviewed.  Physical Exam  Constitutional: He is oriented to person, place, and time. He appears well-developed and well-nourished.  HENT:  Right Ear: Hearing, tympanic membrane, external ear and ear canal normal.  Left Ear: Hearing, tympanic membrane, external ear and ear canal normal.  Nose: Right sinus exhibits maxillary sinus tenderness. Right sinus exhibits no frontal sinus tenderness. Left sinus exhibits maxillary sinus tenderness. Left sinus exhibits no frontal sinus tenderness.  Mouth/Throat: Uvula is midline, oropharynx is clear and moist and mucous membranes are normal.  Neck: Neck supple.  Cardiovascular: Normal rate, regular rhythm, normal heart sounds and intact distal pulses.   Pulmonary/Chest: Effort normal and breath sounds normal.  Neurological: He is alert and oriented to person, place, and time.  Skin: Skin is warm and dry.       Assessment & Plan:  Problem List Items Addressed This Visit      Respiratory   Sinusitis, acute maxillary    Symptoms and exam consistent with acute maxillary sinusitis with underlying seasonal allergic rhinitis. Treat conservatively with over-the-counter medications as needed for symptom relief and supportive care. Start Tussionex as needed for cough and sleep. Written prescription for Augmentin provided if symptoms worsen after watchful waiting. Follow-up if symptoms worsen or do not improve.      Relevant Medications   chlorpheniramine-HYDROcodone (TUSSIONEX PENNKINETIC ER) 10-8 MG/5ML SUER    amoxicillin-clavulanate (AUGMENTIN) 875-125 MG tablet    Other Visit Diagnoses   None.      I am having Mr. Whiteaker start on chlorpheniramine-HYDROcodone and amoxicillin-clavulanate. I am also having him maintain his aspirin, Fish Oil, vitamin E, simvastatin, benazepril, metFORMIN, freestyle, and glucose blood.   Meds ordered this encounter  Medications  . chlorpheniramine-HYDROcodone (TUSSIONEX PENNKINETIC ER) 10-8 MG/5ML SUER    Sig: Take 5 mLs by mouth at bedtime as needed.    Dispense:  115 mL    Refill:  0    Order Specific Question:   Supervising Provider    Answer:   Pricilla Holm A [id number]  . amoxicillin-clavulanate (AUGMENTIN) 875-125 MG tablet    Sig: Take 1 tablet by mouth 2 (two) times daily.    Dispense:  20 tablet    Refill:  0    Order Specific Question:   Supervising Provider    Answer:   Pricilla Holm A [id number]     Follow-up: Return if symptoms worsen or fail to improve.  Mauricio Po, FNP

## 2016-10-16 NOTE — Assessment & Plan Note (Signed)
Symptoms and exam consistent with acute maxillary sinusitis with underlying seasonal allergic rhinitis. Treat conservatively with over-the-counter medications as needed for symptom relief and supportive care. Start Tussionex as needed for cough and sleep. Written prescription for Augmentin provided if symptoms worsen after watchful waiting. Follow-up if symptoms worsen or do not improve.

## 2016-10-16 NOTE — Patient Instructions (Signed)
Thank you for choosing [facility].  SUMMARY AND INSTRUCTIONS:  Medication:  Your prescription(s) have been submitted to your pharmacy or been printed and provided for you. Please take as directed and contact our office if you believe you are having problem(s) with the medication(s) or have any questions.   Follow up:  If your symptoms worsen or fail to improve, please contact our office for further instruction, or in case of emergency go directly to the emergency room at the closest medical facility.    General Recommendations:    Please drink plenty of fluids.  Get plenty of rest   Sleep in humidified air  Use saline nasal sprays  Netti pot   OTC Medications:  Decongestants - helps relieve congestion   Flonase (generic fluticasone) or Nasacort (generic triamcinolone) - please make sure to use the "cross-over" technique at a 45 degree angle towards the opposite eye as opposed to straight up the nasal passageway.   Sudafed (generic pseudoephedrine - Note this is the one that is available behind the pharmacy counter); Products with phenylephrine (-PE) may also be used but is often not as effective as pseudoephedrine.   If you have HIGH BLOOD PRESSURE - Coricidin HBP; AVOID any product that is -D as this contains pseudoephedrine which may increase your blood pressure.  Afrin (oxymetazoline) every 6-8 hours for up to 3 days.   Allergies - helps relieve runny nose, itchy eyes and sneezing   Claritin (generic loratidine), Allegra (fexofenidine), or Zyrtec (generic cyrterizine) for runny nose. These medications should not cause drowsiness.  Note - Benadryl (generic diphenhydramine) may be used however may cause drowsiness  Cough -   Delsym or Robitussin (generic dextromethorphan)  Expectorants - helps loosen mucus to ease removal   Mucinex (generic guaifenesin) as directed on the package.  Headaches / General Aches   Tylenol (generic acetaminophen) - DO  NOT EXCEED 3 grams (3,000 mg) in a 24 hour time period  Advil/Motrin (generic ibuprofen)   Sore Throat -   Salt water gargle   Chloraseptic (generic benzocaine) spray or lozenges / Sucrets (generic dyclonine)    Sinusitis Sinusitis is redness, soreness, and inflammation of the paranasal sinuses. Paranasal sinuses are air pockets within the bones of your face (beneath the eyes, the middle of the forehead, or above the eyes). In healthy paranasal sinuses, mucus is able to drain out, and air is able to circulate through them by way of your nose. However, when your paranasal sinuses are inflamed, mucus and air can become trapped. This can allow bacteria and other germs to grow and cause infection. Sinusitis can develop quickly and last only a short time (acute) or continue over a long period (chronic). Sinusitis that lasts for more than 12 weeks is considered chronic.  CAUSES  Causes of sinusitis include:  Allergies.  Structural abnormalities, such as displacement of the cartilage that separates your nostrils (deviated septum), which can decrease the air flow through your nose and sinuses and affect sinus drainage.  Functional abnormalities, such as when the small hairs (cilia) that line your sinuses and help remove mucus do not work properly or are not present. SIGNS AND SYMPTOMS  Symptoms of acute and chronic sinusitis are the same. The primary symptoms are pain and pressure around the affected sinuses. Other symptoms include:  Upper toothache.  Earache.  Headache.  Bad breath.  Decreased sense of smell and taste.  A cough, which worsens when you are lying flat.  Fatigue.  Fever.  Thick drainage   from your nose, which often is green and may contain pus (purulent).  Swelling and warmth over the affected sinuses. DIAGNOSIS  Your health care provider will perform a physical exam. During the exam, your health care provider may:  Look in your nose for signs of abnormal growths  in your nostrils (nasal polyps).  Tap over the affected sinus to check for signs of infection.  View the inside of your sinuses (endoscopy) using an imaging device that has a light attached (endoscope). If your health care provider suspects that you have chronic sinusitis, one or more of the following tests may be recommended:  Allergy tests.  Nasal culture. A sample of mucus is taken from your nose, sent to a lab, and screened for bacteria.  Nasal cytology. A sample of mucus is taken from your nose and examined by your health care provider to determine if your sinusitis is related to an allergy. TREATMENT  Most cases of acute sinusitis are related to a viral infection and will resolve on their own within 10 days. Sometimes medicines are prescribed to help relieve symptoms (pain medicine, decongestants, nasal steroid sprays, or saline sprays).  However, for sinusitis related to a bacterial infection, your health care provider will prescribe antibiotic medicines. These are medicines that will help kill the bacteria causing the infection.  Rarely, sinusitis is caused by a fungal infection. In theses cases, your health care provider will prescribe antifungal medicine. For some cases of chronic sinusitis, surgery is needed. Generally, these are cases in which sinusitis recurs more than 3 times per year, despite other treatments. HOME CARE INSTRUCTIONS   Drink plenty of water. Water helps thin the mucus so your sinuses can drain more easily.  Use a humidifier.  Inhale steam 3 to 4 times a day (for example, sit in the bathroom with the shower running).  Apply a warm, moist washcloth to your face 3 to 4 times a day, or as directed by your health care provider.  Use saline nasal sprays to help moisten and clean your sinuses.  Take medicines only as directed by your health care provider.  If you were prescribed either an antibiotic or antifungal medicine, finish it all even if you start to feel  better. SEEK IMMEDIATE MEDICAL CARE IF:  You have increasing pain or severe headaches.  You have nausea, vomiting, or drowsiness.  You have swelling around your face.  You have vision problems.  You have a stiff neck.  You have difficulty breathing. MAKE SURE YOU:   Understand these instructions.  Will watch your condition.  Will get help right away if you are not doing well or get worse. Document Released: 12/03/2005 Document Revised: 04/19/2014 Document Reviewed: 12/18/2011 Exit[employer] Patient Information 2015 [employer]. This information is not intended to replace advice given to you by your health care provider. Make sure you discuss any questions you have with your health care provider.   

## 2016-10-22 DIAGNOSIS — H2513 Age-related nuclear cataract, bilateral: Secondary | ICD-10-CM | POA: Diagnosis not present

## 2016-10-22 DIAGNOSIS — H524 Presbyopia: Secondary | ICD-10-CM | POA: Diagnosis not present

## 2016-10-22 DIAGNOSIS — D3131 Benign neoplasm of right choroid: Secondary | ICD-10-CM | POA: Diagnosis not present

## 2016-10-22 DIAGNOSIS — E119 Type 2 diabetes mellitus without complications: Secondary | ICD-10-CM | POA: Diagnosis not present

## 2016-10-22 DIAGNOSIS — H52203 Unspecified astigmatism, bilateral: Secondary | ICD-10-CM | POA: Diagnosis not present

## 2016-10-22 DIAGNOSIS — H5203 Hypermetropia, bilateral: Secondary | ICD-10-CM | POA: Diagnosis not present

## 2016-11-05 ENCOUNTER — Encounter: Payer: Self-pay | Admitting: Internal Medicine

## 2016-11-05 ENCOUNTER — Other Ambulatory Visit (INDEPENDENT_AMBULATORY_CARE_PROVIDER_SITE_OTHER): Payer: Medicare Other

## 2016-11-05 ENCOUNTER — Ambulatory Visit (INDEPENDENT_AMBULATORY_CARE_PROVIDER_SITE_OTHER): Payer: Medicare Other | Admitting: Internal Medicine

## 2016-11-05 VITALS — BP 158/88 | HR 65 | Temp 98.4°F | Resp 14 | Ht 72.0 in | Wt 165.0 lb

## 2016-11-05 DIAGNOSIS — I10 Essential (primary) hypertension: Secondary | ICD-10-CM | POA: Diagnosis not present

## 2016-11-05 DIAGNOSIS — Z0000 Encounter for general adult medical examination without abnormal findings: Secondary | ICD-10-CM

## 2016-11-05 DIAGNOSIS — E1151 Type 2 diabetes mellitus with diabetic peripheral angiopathy without gangrene: Secondary | ICD-10-CM

## 2016-11-05 DIAGNOSIS — E782 Mixed hyperlipidemia: Secondary | ICD-10-CM

## 2016-11-05 LAB — COMPREHENSIVE METABOLIC PANEL
ALT: 13 U/L (ref 0–53)
AST: 25 U/L (ref 0–37)
Albumin: 4.4 g/dL (ref 3.5–5.2)
Alkaline Phosphatase: 45 U/L (ref 39–117)
BUN: 17 mg/dL (ref 6–23)
CHLORIDE: 107 meq/L (ref 96–112)
CO2: 22 mEq/L (ref 19–32)
Calcium: 9.4 mg/dL (ref 8.4–10.5)
Creatinine, Ser: 0.82 mg/dL (ref 0.40–1.50)
GFR: 97.96 mL/min (ref >60.00)
GLUCOSE: 131 mg/dL — AB (ref 70–99)
POTASSIUM: 5.1 meq/L (ref 3.5–5.1)
SODIUM: 141 meq/L (ref 135–145)
TOTAL PROTEIN: 6.7 g/dL (ref 6.0–8.3)
Total Bilirubin: 1.3 mg/dL — ABNORMAL HIGH (ref 0.2–1.2)

## 2016-11-05 LAB — LIPID PANEL
Cholesterol: 161 mg/dL (ref 0–200)
HDL: 62.3 mg/dL (ref >39.00)
LDL CALC: 85 mg/dL (ref 0–99)
NONHDL: 98.2
Total CHOL/HDL Ratio: 3
Triglycerides: 65 mg/dL (ref 0.0–149.0)
VLDL: 13 mg/dL (ref 0.0–40.0)

## 2016-11-05 LAB — HEMOGLOBIN A1C: HEMOGLOBIN A1C: 7.1 % — AB (ref 4.6–6.5)

## 2016-11-05 MED ORDER — BENAZEPRIL HCL 40 MG PO TABS: 40.0000 mg | ORAL_TABLET | Freq: Every day | ORAL | 3 refills | Status: DC

## 2016-11-05 MED ORDER — SIMVASTATIN 40 MG PO TABS: 40.0000 mg | ORAL_TABLET | Freq: Every day | ORAL | 3 refills | Status: DC

## 2016-11-05 NOTE — Assessment & Plan Note (Signed)
Checking lipid panel and adjust simvastatin as needed.  

## 2016-11-05 NOTE — Assessment & Plan Note (Signed)
Checking HgA1c and adjust as needed. Taking metformin daily. Foot exam done. Hx tia which complicates this.

## 2016-11-05 NOTE — Progress Notes (Signed)
Pre visit review using our clinic review tool, if applicable. No additional management support is needed unless otherwise documented below in the visit note. 

## 2016-11-05 NOTE — Assessment & Plan Note (Signed)
BP mildly above goal today with no meds this morning. Previous at goal. Taking benazepril daily.

## 2016-11-05 NOTE — Patient Instructions (Signed)
We are checking the labs today and will call you back with the results.   Health Maintenance, Male A healthy lifestyle and preventative care can promote health and wellness.  Maintain regular health, dental, and eye exams.  Eat a healthy diet. Foods like vegetables, fruits, whole grains, low-fat dairy products, and lean protein foods contain the nutrients you need and are low in calories. Decrease your intake of foods high in solid fats, added sugars, and salt. Get information about a proper diet from your health care provider, if necessary.  Regular physical exercise is one of the most important things you can do for your health. Most adults should get at least 150 minutes of moderate-intensity exercise (any activity that increases your heart rate and causes you to sweat) each week. In addition, most adults need muscle-strengthening exercises on 2 or more days a week.   Maintain a healthy weight. The body mass index (BMI) is a screening tool to identify possible weight problems. It provides an estimate of body fat based on height and weight. Your health care provider can find your BMI and can help you achieve or maintain a healthy weight. For males 20 years and older:  A BMI below 18.5 is considered underweight.  A BMI of 18.5 to 24.9 is normal.  A BMI of 25 to 29.9 is considered overweight.  A BMI of 30 and above is considered obese.  Maintain normal blood lipids and cholesterol by exercising and minimizing your intake of saturated fat. Eat a balanced diet with plenty of fruits and vegetables. Blood tests for lipids and cholesterol should begin at age 61 and be repeated every 5 years. If your lipid or cholesterol levels are high, you are over age 44, or you are at high risk for heart disease, you may need your cholesterol levels checked more frequently.Ongoing high lipid and cholesterol levels should be treated with medicines if diet and exercise are not working.  If you smoke, find out  from your health care provider how to quit. If you do not use tobacco, do not start.  Lung cancer screening is recommended for adults aged 29-80 years who are at high risk for developing lung cancer because of a history of smoking. A yearly low-dose CT scan of the lungs is recommended for people who have at least a 30-pack-year history of smoking and are current smokers or have quit within the past 15 years. A pack year of smoking is smoking an average of 1 pack of cigarettes a day for 1 year (for example, a 30-pack-year history of smoking could mean smoking 1 pack a day for 30 years or 2 packs a day for 15 years). Yearly screening should continue until the smoker has stopped smoking for at least 15 years. Yearly screening should be stopped for people who develop a health problem that would prevent them from having lung cancer treatment.  If you choose to drink alcohol, do not have more than 2 drinks per day. One drink is considered to be 12 oz (360 mL) of beer, 5 oz (150 mL) of wine, or 1.5 oz (45 mL) of liquor.  Avoid the use of street drugs. Do not share needles with anyone. Ask for help if you need support or instructions about stopping the use of drugs.  High blood pressure causes heart disease and increases the risk of stroke. High blood pressure is more likely to develop in:  People who have blood pressure in the end of the normal range (100-139/85-89  mm Hg).  People who are overweight or obese.  People who are African American.  If you are 62-75 years of age, have your blood pressure checked every 3-5 years. If you are 68 years of age or older, have your blood pressure checked every year. You should have your blood pressure measured twice-once when you are at a hospital or clinic, and once when you are not at a hospital or clinic. Record the average of the two measurements. To check your blood pressure when you are not at a hospital or clinic, you can use:  An automated blood pressure  machine at a pharmacy.  A home blood pressure monitor.  If you are 75-59 years old, ask your health care provider if you should take aspirin to prevent heart disease.  Diabetes screening involves taking a blood sample to check your fasting blood sugar level. This should be done once every 3 years after age 35 if you are at a normal weight and without risk factors for diabetes. Testing should be considered at a younger age or be carried out more frequently if you are overweight and have at least 1 risk factor for diabetes.  Colorectal cancer can be detected and often prevented. Most routine colorectal cancer screening begins at the age of 28 and continues through age 32. However, your health care provider may recommend screening at an earlier age if you have risk factors for colon cancer. On a yearly basis, your health care provider may provide home test kits to check for hidden blood in the stool. A small camera at the end of a tube may be used to directly examine the colon (sigmoidoscopy or colonoscopy) to detect the earliest forms of colorectal cancer. Talk to your health care provider about this at age 53 when routine screening begins. A direct exam of the colon should be repeated every 5-10 years through age 98, unless early forms of precancerous polyps or small growths are found.  People who are at an increased risk for hepatitis B should be screened for this virus. You are considered at high risk for hepatitis B if:  You were born in a country where hepatitis B occurs often. Talk with your health care provider about which countries are considered high risk.  Your parents were born in a high-risk country and you have not received a shot to protect against hepatitis B (hepatitis B vaccine).  You have HIV or AIDS.  You use needles to inject street drugs.  You live with, or have sex with, someone who has hepatitis B.  You are a man who has sex with other men (MSM).  You get hemodialysis  treatment.  You take certain medicines for conditions like cancer, organ transplantation, and autoimmune conditions.  Hepatitis C blood testing is recommended for all people born from [birth year] through 1965 and any individual with known risk factors for hepatitis C.  Healthy men should no longer receive prostate-specific antigen (PSA) blood tests as part of routine cancer screening. Talk to your health care provider about prostate cancer screening.  Testicular cancer screening is not recommended for adolescents or adult males who have no symptoms. Screening includes self-exam, a health care provider exam, and other screening tests. Consult with your health care provider about any symptoms you have or any concerns you have about testicular cancer.  Practice safe sex. Use condoms and avoid high-risk sexual practices to reduce the spread of sexually transmitted infections (STIs).  You should be screened for STIs, including gonorrhea  and chlamydia if:  You are sexually active and are younger than 24 years.  You are older than 24 years, and your health care provider tells you that you are at risk for this type of infection.  Your sexual activity has changed since you were last screened, and you are at an increased risk for chlamydia or gonorrhea. Ask your health care provider if you are at risk.  If you are at risk of being infected with HIV, it is recommended that you take a prescription medicine daily to prevent HIV infection. This is called pre-exposure prophylaxis (PrEP). You are considered at risk if:  You are a man who has sex with other men (MSM).  You are a heterosexual man who is sexually active with multiple partners.  You take drugs by injection.  You are sexually active with a partner who has HIV.  Talk with your health care provider about whether you are at high risk of being infected with HIV. If you choose to begin PrEP, you should first be tested for HIV. You should then be tested  every 3 months for as long as you are taking PrEP.  Use sunscreen. Apply sunscreen liberally and repeatedly throughout the day. You should seek shade when your shadow is shorter than you. Protect yourself by wearing long sleeves, pants, a wide-brimmed hat, and sunglasses year round whenever you are outdoors.  Tell your health care provider of new moles or changes in moles, especially if there is a change in shape or color. Also, tell your health care provider if a mole is larger than the size of a pencil eraser.  A one-time screening for abdominal aortic aneurysm (AAA) and surgical repair of large AAAs by ultrasound is recommended for men aged 66-75 years who are current or former smokers.  Stay current with your vaccines (immunizations). This information is not intended to replace advice given to you by your health care provider. Make sure you discuss any questions you have with your health care provider. Document Released: 05/31/2008 Document Revised: 12/24/2014 Document Reviewed: 09/06/2015 Elsevier Interactive Patient Education  2017 [employer].
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
## 2016-11-05 NOTE — Assessment & Plan Note (Signed)
Declines hep c screening today. Flu shot done this season, tetanus up to date. Declines shingles. Colonoscopy up to date. Exercising daily. Counseled about sun safety and mole surveillance. Given 10 year screening recommendations.

## 2017-03-12 DIAGNOSIS — I10 Essential (primary) hypertension: Secondary | ICD-10-CM | POA: Diagnosis not present

## 2017-03-12 DIAGNOSIS — F5101 Primary insomnia: Secondary | ICD-10-CM | POA: Diagnosis not present

## 2017-03-12 DIAGNOSIS — J301 Allergic rhinitis due to pollen: Secondary | ICD-10-CM | POA: Diagnosis not present

## 2017-03-12 DIAGNOSIS — E782 Mixed hyperlipidemia: Secondary | ICD-10-CM | POA: Diagnosis not present

## 2017-03-12 DIAGNOSIS — E119 Type 2 diabetes mellitus without complications: Secondary | ICD-10-CM | POA: Diagnosis not present

## 2017-06-07 DIAGNOSIS — N400 Enlarged prostate without lower urinary tract symptoms: Secondary | ICD-10-CM | POA: Diagnosis not present

## 2017-06-14 DIAGNOSIS — R351 Nocturia: Secondary | ICD-10-CM | POA: Diagnosis not present

## 2017-06-14 DIAGNOSIS — N5201 Erectile dysfunction due to arterial insufficiency: Secondary | ICD-10-CM | POA: Diagnosis not present

## 2017-06-14 DIAGNOSIS — Z87442 Personal history of urinary calculi: Secondary | ICD-10-CM | POA: Diagnosis not present

## 2017-07-15 ENCOUNTER — Other Ambulatory Visit: Payer: Self-pay

## 2017-08-14 ENCOUNTER — Telehealth: Payer: Self-pay | Admitting: Internal Medicine

## 2017-08-14 NOTE — Telephone Encounter (Signed)
Spoke with pt's wife [place]. She stated that Mr. Dashner was not available and to give him a call back to schedule AWV appt.  AWV can be scheduled anytime after 11/05/2017.

## 2017-08-23 ENCOUNTER — Ambulatory Visit: Payer: Medicare Other

## 2017-09-02 ENCOUNTER — Other Ambulatory Visit: Payer: Self-pay | Admitting: Internal Medicine

## 2017-09-25 DIAGNOSIS — Z23 Encounter for immunization: Secondary | ICD-10-CM | POA: Diagnosis not present

## 2017-10-18 ENCOUNTER — Encounter: Payer: Self-pay | Admitting: Internal Medicine

## 2017-10-18 DIAGNOSIS — H25013 Cortical age-related cataract, bilateral: Secondary | ICD-10-CM | POA: Diagnosis not present

## 2017-10-18 DIAGNOSIS — Z7984 Long term (current) use of oral hypoglycemic drugs: Secondary | ICD-10-CM | POA: Diagnosis not present

## 2017-10-18 DIAGNOSIS — H2513 Age-related nuclear cataract, bilateral: Secondary | ICD-10-CM | POA: Diagnosis not present

## 2017-10-18 DIAGNOSIS — H524 Presbyopia: Secondary | ICD-10-CM | POA: Diagnosis not present

## 2017-10-18 DIAGNOSIS — E119 Type 2 diabetes mellitus without complications: Secondary | ICD-10-CM | POA: Diagnosis not present

## 2017-10-18 DIAGNOSIS — H5203 Hypermetropia, bilateral: Secondary | ICD-10-CM | POA: Diagnosis not present

## 2017-10-18 DIAGNOSIS — D3131 Benign neoplasm of right choroid: Secondary | ICD-10-CM | POA: Diagnosis not present

## 2017-10-18 DIAGNOSIS — H52203 Unspecified astigmatism, bilateral: Secondary | ICD-10-CM | POA: Diagnosis not present

## 2017-10-18 LAB — HM DIABETES EYE EXAM

## 2017-11-05 NOTE — Progress Notes (Signed)
Subjective:   Christopher Bradshaw is a 71 y.o. male who presents for Medicare Annual/Subsequent preventive examination.  Review of Systems:  No ROS.  Medicare Wellness Visit. Additional risk factors are reflected in the social history.  Cardiac Risk Factors include: advanced age (>7men, >60 women);diabetes mellitus;dyslipidemia;hypertension  Sleep patterns: feels rested on waking, gets up 1-2 times nightly to void and sleeps 6-7 hours nightly.    Home Safety/Smoke Alarms: Feels safe in home. Smoke alarms in place.  Living environment; residence and Firearm Safety: 2-story house, no firearms. Lives with wife, no needs for DME, good support system Seat Belt Safety/Bike Helmet: Wears seat belt.      Objective:    Vitals: BP 130/80 (BP Location: Left Arm, Patient Position: Sitting, Cuff Size: Normal)   Pulse 63   Temp 97.6 F (36.4 C) (Oral)   Ht 6' (1.829 m)   Wt 168 lb (76.2 kg)   SpO2 99%   BMI 22.78 kg/m   Body mass index is 22.78 kg/m.  Tobacco Social History   Tobacco Use  Smoking Status Never Smoker  Smokeless Tobacco Never Used     Counseling given: Not Answered   Past Medical History:  Diagnosis Date  . Allergic rhinitis   . BPH (benign prostatic hypertrophy)    Dr Jeffie Pollock  . Diabetes mellitus without complication ([place])    resolved with TLC  . Femoral bruit   . Gilbert syndrome    total bilirubin 1.5 in 2006  . Hyperglycemia   . Hyperlipidemia   . Hypertension   . Nephrolithiasis    calcium oxalate stone  . RBBB (right bundle branch block)   . Thrombophlebitis 09/2004  . Varicose vein    right lower extremity   Past Surgical History:  Procedure Laterality Date  . COLONOSCOPY  2000, 7209,4709   negative X 3;Dr  Delfin Edis  . CYSTOSCOPY     for hematuria,urethritis 2004, Dr Serita Butcher  . lithrotripsy     for kidney stones  . NASAL SEPTUM SURGERY     deviation correction  . Renal calculi lithotripsy     x 1,residual L renal calculus, spontaneous  passage 10-12 x  . TONSILLECTOMY     Family History  Problem Relation Age of Onset  . Lung cancer Father        smoker  . Alzheimer's disease Father   . Colon cancer Paternal Aunt   . Ovarian cancer Paternal Aunt   . Emphysema Paternal Grandfather   . Colon cancer Paternal Grandfather   . Diabetes Neg Hx   . Heart disease Neg Hx   . Stroke Neg Hx    Social History   Substance and Sexual Activity  Sexual Activity Not on file    Outpatient Encounter Medications as of 11/06/2017  Medication Sig  . aspirin 81 MG tablet Take 81 mg by mouth daily.    . benazepril (LOTENSIN) 40 MG tablet Take 1 tablet (40 mg total) by mouth daily.  Marland Kitchen glucose blood (FREESTYLE LITE) test strip USE TO TEST BLOOD SUGAR ONCE DAILY ICD E11 59  . Lancets (FREESTYLE) lancets USE TO CHECK BLOOD SUGAR ONCE DAILY ICD E11 59  . metFORMIN (GLUCOPHAGE) 500 MG tablet Take 1 tablet (500 mg total) by mouth 2 (two) times daily with a meal. Keep 11/06/2017 appointment for further refills  . Omega-3 Fatty Acids (FISH OIL) 1000 MG CAPS Take by mouth.    . simvastatin (ZOCOR) 40 MG tablet Take 1 tablet (40 mg total)  by mouth at bedtime.  . vitamin E 100 UNIT capsule Take 400 Units by mouth daily.   . [DISCONTINUED] benazepril (LOTENSIN) 40 MG tablet Take 1 tablet (40 mg total) by mouth daily.  . [DISCONTINUED] glucose blood (FREESTYLE LITE) test strip USE TO TEST BLOOD SUGAR ONCE DAILY ICD E11 59  . [DISCONTINUED] Lancets (FREESTYLE) lancets USE TO CHECK BLOOD SUGAR ONCE DAILY ICD E11 65  . [DISCONTINUED] metFORMIN (GLUCOPHAGE) 500 MG tablet Take 1 tablet (500 mg total) by mouth 2 (two) times daily with a meal. Keep 11/06/2017 appointment for further refills  . [DISCONTINUED] simvastatin (ZOCOR) 40 MG tablet Take 1 tablet (40 mg total) by mouth at bedtime.   No facility-administered encounter medications on file as of 11/06/2017.     Activities of Daily Living In your present state of health, do you have any difficulty  performing the following activities: 11/06/2017  Hearing? N  Vision? N  Difficulty concentrating or making decisions? N  Walking or climbing stairs? N  Dressing or bathing? N  Doing errands, shopping? N  Preparing Food and eating ? N  Using the Toilet? N  In the past six months, have you accidently leaked urine? N  Do you have problems with loss of bowel control? N  Managing your Medications? N  Managing your Finances? N  Housekeeping or managing your Housekeeping? N  Some recent data might be hidden    Patient Care Team: Hoyt Koch, MD as PCP - General (Internal Medicine)   Assessment:    Physical assessment deferred to PCP.  Exercise Activities and Dietary recommendations Current Exercise Habits: Home exercise routine;Structured exercise class, Type of exercise: walking;stretching;strength training/weights, Time (Minutes): 50, Frequency (Times/Week): 5, Weekly Exercise (Minutes/Week): 250, Intensity: Mild, Exercise limited by: None identified  Diet (meal preparation, eat out, water intake, caffeinated beverages, dairy products, fruits and vegetables): in general, a "healthy" diet  , well balanced  Reviewed heart healthy and diabetic diet       Goals    . Patient Stated     Maintain current health status, stay as active and as independent as possible.      Fall Risk Fall Risk  11/06/2017 04/03/2016 01/04/2015 09/29/2013  Falls in the past year? No No No No   Depression Screen PHQ 2/9 Scores 11/06/2017 11/06/2017 04/03/2016 01/04/2015  PHQ - 2 Score 0 0 0 0  PHQ- 9 Score 0 - - -    Cognitive Function       Ad8 score reviewed for issues:  Issues making decisions: no  Less interest in hobbies / activities: no  Repeats questions, stories (family complaining): no  Trouble using ordinary gadgets (microwave, computer, phone):no  Forgets the month or year: no  Mismanaging finances: no  Remembering appts: no  Daily problems with thinking and/or  memory: no Ad8 score is= 0 Immunization History  Administered Date(s) Administered  . Influenza Split 09/18/2012  . Influenza, High Dose Seasonal PF 09/29/2013, 08/31/2016  . Influenza,inj,Quad PF,6+ Mos 10/08/2014, 10/21/2015  . Influenza-Unspecified 09/25/2017  . Pneumococcal Conjugate-13 10/20/2015  . Pneumococcal Polysaccharide-23 07/20/2011  . Td 01/25/2009   Screening Tests Health Maintenance  Topic Date Due  . Hepatitis C Screening  04/08/1946  . HEMOGLOBIN A1C  05/05/2017  . OPHTHALMOLOGY EXAM  10/18/2018  . FOOT EXAM  11/06/2018  . TETANUS/TDAP  01/25/2019  . COLONOSCOPY  10/29/2023  . INFLUENZA VACCINE  Completed  . PNA vac Low Risk Adult  Completed      Plan:  Continue doing brain stimulating activities (puzzles, reading, adult coloring books, staying active) to keep memory sharp.   Continue to eat heart healthy diet (full of fruits, vegetables, whole grains, lean protein, water--limit salt, fat, and sugar intake) and increase physical activity as tolerated.  I have personally reviewed and noted the following in the patient's chart:   . Medical and social history . Use of alcohol, tobacco or illicit drugs  . Current medications and supplements . Functional ability and status . Nutritional status . Physical activity . Advanced directives . List of other physicians . Vitals . Screenings to include cognitive, depression, and falls . Referrals and appointments  In addition, I have reviewed and discussed with patient certain preventive protocols, quality metrics, and best practice recommendations. A written personalized care plan for preventive services as well as general preventive health recommendations were provided to patient.     Michiel Cowboy, RN  11/06/2017

## 2017-11-06 ENCOUNTER — Encounter: Payer: Self-pay | Admitting: Internal Medicine

## 2017-11-06 ENCOUNTER — Ambulatory Visit: Payer: Medicare Other

## 2017-11-06 ENCOUNTER — Other Ambulatory Visit (INDEPENDENT_AMBULATORY_CARE_PROVIDER_SITE_OTHER): Payer: Medicare Other

## 2017-11-06 ENCOUNTER — Ambulatory Visit (INDEPENDENT_AMBULATORY_CARE_PROVIDER_SITE_OTHER): Payer: Medicare Other | Admitting: Internal Medicine

## 2017-11-06 VITALS — BP 130/80 | HR 63 | Temp 97.6°F | Ht 72.0 in | Wt 168.0 lb

## 2017-11-06 DIAGNOSIS — E785 Hyperlipidemia, unspecified: Secondary | ICD-10-CM

## 2017-11-06 DIAGNOSIS — I10 Essential (primary) hypertension: Secondary | ICD-10-CM | POA: Diagnosis not present

## 2017-11-06 DIAGNOSIS — E1151 Type 2 diabetes mellitus with diabetic peripheral angiopathy without gangrene: Secondary | ICD-10-CM

## 2017-11-06 DIAGNOSIS — Z0000 Encounter for general adult medical examination without abnormal findings: Secondary | ICD-10-CM

## 2017-11-06 DIAGNOSIS — E1169 Type 2 diabetes mellitus with other specified complication: Secondary | ICD-10-CM

## 2017-11-06 LAB — COMPREHENSIVE METABOLIC PANEL
ALT: 15 U/L (ref 0–53)
AST: 17 U/L (ref 0–37)
Albumin: 4.5 g/dL (ref 3.5–5.2)
Alkaline Phosphatase: 49 U/L (ref 39–117)
BILIRUBIN TOTAL: 1.1 mg/dL (ref 0.2–1.2)
BUN: 15 mg/dL (ref 6–23)
CO2: 29 meq/L (ref 19–32)
CREATININE: 0.75 mg/dL (ref 0.40–1.50)
Calcium: 9.7 mg/dL (ref 8.4–10.5)
Chloride: 103 mEq/L (ref 96–112)
GFR: 108.29 mL/min (ref >60.00)
GLUCOSE: 124 mg/dL — AB (ref 70–99)
Potassium: 4 mEq/L (ref 3.5–5.1)
Sodium: 139 mEq/L (ref 135–145)
Total Protein: 6.6 g/dL (ref 6.0–8.3)

## 2017-11-06 LAB — LIPID PANEL
CHOL/HDL RATIO: 3
Cholesterol: 143 mg/dL (ref 0–200)
HDL: 49.4 mg/dL (ref >39.00)
LDL CALC: 76 mg/dL (ref 0–99)
NonHDL: 93.11
Triglycerides: 87 mg/dL (ref 0.0–149.0)
VLDL: 17.4 mg/dL (ref 0.0–40.0)

## 2017-11-06 LAB — CBC
HCT: 46 % (ref 39.0–52.0)
Hemoglobin: 15.6 g/dL (ref 13.0–17.0)
MCHC: 33.9 g/dL (ref 30.0–36.0)
MCV: 93.2 fl (ref 78.0–100.0)
Platelets: 114 10*3/uL — ABNORMAL LOW (ref 150.0–400.0)
RBC: 4.94 Mil/uL (ref 4.22–5.81)
RDW: 13 % (ref 11.5–15.5)
WBC: 7.8 10*3/uL (ref 4.0–10.5)

## 2017-11-06 LAB — HEMOGLOBIN A1C: HEMOGLOBIN A1C: 6.9 % — AB (ref 4.6–6.5)

## 2017-11-06 MED ORDER — GLUCOSE BLOOD VI STRP: ORAL_STRIP | 11 refills | Status: DC

## 2017-11-06 MED ORDER — METFORMIN HCL 500 MG PO TABS: 500.0000 mg | ORAL_TABLET | Freq: Two times a day (BID) | ORAL | 0 refills | Status: DC

## 2017-11-06 MED ORDER — FREESTYLE LANCETS MISC: 11 refills | Status: DC

## 2017-11-06 MED ORDER — BENAZEPRIL HCL 40 MG PO TABS: 40.0000 mg | ORAL_TABLET | Freq: Every day | ORAL | 3 refills | Status: DC

## 2017-11-06 MED ORDER — SIMVASTATIN 40 MG PO TABS: 40.0000 mg | ORAL_TABLET | Freq: Every day | ORAL | 3 refills | Status: DC

## 2017-11-06 NOTE — Progress Notes (Signed)
Patient ID: Christopher Bradshaw, male   DOB: 09-Jun-1946, 71 y.o.   MRN: [id number] Medical treatment/procedure(s) were performed by non-physician practitioner and as supervising physician I was immediately available for consultation/collaboration. I agree with above. Hoyt Koch, MD

## 2017-11-06 NOTE — Progress Notes (Signed)
   Subjective:    Patient ID: Christopher Bradshaw, male    DOB: [birth year], 71 y.o.   MRN: [id number]  HPI The patient is a 71 YO man coming in for follow up of diabetes (controlled with metformin, vascular complications, taking ACE-I and statin, denies hypoglycemia, numbness or weakness), and blood pressure (at goal on benazepril, denies chest pains or SOB or headaches, denies side effects), and cholesterol (taking simvastatin daily, no new CAD complications). No new concerns.  Review of Systems  Constitutional: Negative.   HENT: Negative.   Eyes: Negative.   Respiratory: Negative for cough, chest tightness and shortness of breath.   Cardiovascular: Negative for chest pain, palpitations and leg swelling.  Gastrointestinal: Negative for abdominal distention, abdominal pain, constipation, diarrhea, nausea and vomiting.  Musculoskeletal: Negative.   Skin: Negative.   Neurological: Negative.   Psychiatric/Behavioral: Negative.       Objective:   Physical Exam  Constitutional: He is oriented to person, place, and time. He appears well-developed and well-nourished.  HENT:  Head: Normocephalic and atraumatic.  Eyes: EOM are normal.  Neck: Normal range of motion.  Cardiovascular: Normal rate and regular rhythm.  Pulmonary/Chest: Effort normal and breath sounds normal. No respiratory distress. He has no wheezes. He has no rales.  Abdominal: Soft. Bowel sounds are normal. He exhibits no distension. There is no tenderness. There is no rebound.  Musculoskeletal: He exhibits no edema.  Neurological: He is alert and oriented to person, place, and time. Coordination normal.  Skin: Skin is warm and dry.  Foot exam done  Psychiatric: He has a normal mood and affect.   Vitals:   11/06/17 1020  BP: 130/80  Pulse: 63  Temp: 97.6 F (36.4 C)  TempSrc: Oral  SpO2: 99%  Weight: 168 lb (76.2 kg)  Height: 6' (1.829 m)      Assessment & Plan:

## 2017-11-06 NOTE — Patient Instructions (Signed)
Continue doing brain stimulating activities (puzzles, reading, adult coloring books, staying active) to keep memory sharp.   Continue to eat heart healthy diet (full of fruits, vegetables, whole grains, lean protein, water--limit salt, fat, and sugar intake) and increase physical activity as tolerated.   Christopher Bradshaw , Thank you for taking time to come for your Medicare Wellness Visit. I appreciate your ongoing commitment to your health goals. Please review the following plan we discussed and let me know if I can assist you in the future.   These are the goals we discussed: Goals    . Patient Stated     Maintain current health status, stay as active and as independent as possible.       This is a list of the screening recommended for you and due dates:  Health Maintenance  Topic Date Due  .  Hepatitis C: One time screening is recommended by Center for Disease Control  (CDC) for  adults born from [birth year] through 1967.   1946/04/01  . Hemoglobin A1C  05/05/2017  . Complete foot exam   11/05/2017  . Eye exam for diabetics  10/18/2018  . Tetanus Vaccine  01/25/2019  . Colon Cancer Screening  10/29/2023  . Flu Shot  Completed  . Pneumonia vaccines  Completed

## 2017-11-06 NOTE — Assessment & Plan Note (Signed)
Checking lipid panel and adjust as needed. Taking simvastatin 40 mg daily. Goal LDL <100.

## 2017-11-06 NOTE — Assessment & Plan Note (Signed)
Checking HgA1c, foot exam done. Taking metformin 514 mg BID, complicated by past TIA. Eye exam up to date. On ACE-I and statin. Adjust as needed.

## 2017-11-06 NOTE — Assessment & Plan Note (Signed)
Taking benazepril daily and checking CMP and adjust as needed.

## 2018-02-03 DIAGNOSIS — H25012 Cortical age-related cataract, left eye: Secondary | ICD-10-CM | POA: Diagnosis not present

## 2018-02-03 DIAGNOSIS — H2511 Age-related nuclear cataract, right eye: Secondary | ICD-10-CM | POA: Diagnosis not present

## 2018-02-03 DIAGNOSIS — E119 Type 2 diabetes mellitus without complications: Secondary | ICD-10-CM | POA: Diagnosis not present

## 2018-02-03 DIAGNOSIS — D3131 Benign neoplasm of right choroid: Secondary | ICD-10-CM | POA: Diagnosis not present

## 2018-02-03 DIAGNOSIS — Z7984 Long term (current) use of oral hypoglycemic drugs: Secondary | ICD-10-CM | POA: Diagnosis not present

## 2018-02-03 DIAGNOSIS — H25011 Cortical age-related cataract, right eye: Secondary | ICD-10-CM | POA: Diagnosis not present

## 2018-02-03 DIAGNOSIS — H2512 Age-related nuclear cataract, left eye: Secondary | ICD-10-CM | POA: Diagnosis not present

## 2018-03-05 DIAGNOSIS — H25011 Cortical age-related cataract, right eye: Secondary | ICD-10-CM | POA: Diagnosis not present

## 2018-03-05 DIAGNOSIS — H2512 Age-related nuclear cataract, left eye: Secondary | ICD-10-CM | POA: Diagnosis not present

## 2018-03-05 DIAGNOSIS — H2511 Age-related nuclear cataract, right eye: Secondary | ICD-10-CM | POA: Diagnosis not present

## 2018-03-05 DIAGNOSIS — H25012 Cortical age-related cataract, left eye: Secondary | ICD-10-CM | POA: Diagnosis not present

## 2018-03-05 HISTORY — PX: OTHER SURGICAL HISTORY: SHX169

## 2018-03-12 DIAGNOSIS — H2512 Age-related nuclear cataract, left eye: Secondary | ICD-10-CM | POA: Diagnosis not present

## 2018-03-12 DIAGNOSIS — H25012 Cortical age-related cataract, left eye: Secondary | ICD-10-CM | POA: Diagnosis not present

## 2018-03-19 ENCOUNTER — Ambulatory Visit (INDEPENDENT_AMBULATORY_CARE_PROVIDER_SITE_OTHER): Payer: Medicare Other | Admitting: Internal Medicine

## 2018-03-19 ENCOUNTER — Encounter: Payer: Self-pay | Admitting: Internal Medicine

## 2018-03-19 DIAGNOSIS — J301 Allergic rhinitis due to pollen: Secondary | ICD-10-CM

## 2018-03-19 MED ORDER — METHYLPREDNISOLONE ACETATE 40 MG/ML IJ SUSP
40.0000 mg | Freq: Once | INTRAMUSCULAR | Status: AC
  Administered 2018-03-19: 40 mg via INTRAMUSCULAR

## 2018-03-19 NOTE — Assessment & Plan Note (Signed)
Depo-medrol 40 mg IM given at visit. He will continue claritin daily and consider adding flonase if needed.

## 2018-03-19 NOTE — Patient Instructions (Signed)
We have given you the shot today. Let us know if you have any problems.

## 2018-03-19 NOTE — Progress Notes (Signed)
   Subjective:    Patient ID: Christopher Bradshaw, male    DOB: Mar 07, 1946, 72 y.o.   MRN: [id number]  HPI The patient is a 72 YO man coming in for sinus and allergy problems. Generally this happens about this time of year. He is having some sinus pressure. He is having some headaches rarely. He denies fevers or chills. Generally resolves with steroid injection for the season. He takes daily claritin. Does have pollen allergies.   Review of Systems  Constitutional: Positive for activity change and appetite change. Negative for chills, fatigue, fever and unexpected weight change.  HENT: Positive for congestion, postnasal drip, rhinorrhea and sinus pressure. Negative for ear discharge, ear pain, sinus pain, sneezing, sore throat, tinnitus, trouble swallowing and voice change.   Eyes: Negative.   Respiratory: Positive for cough. Negative for chest tightness, shortness of breath and wheezing.   Cardiovascular: Negative.   Gastrointestinal: Negative.   Musculoskeletal: Negative.   Neurological: Negative.       Objective:   Physical Exam  Constitutional: He is oriented to person, place, and time. He appears well-developed and well-nourished.  HENT:  Head: Normocephalic and atraumatic.  Oropharynx with redness and clear drainage, nose with swollen turbinates, TMs normal bilaterally  Eyes: EOM are normal.  Neck: Normal range of motion. No thyromegaly present.  Cardiovascular: Normal rate and regular rhythm.  Pulmonary/Chest: Effort normal and breath sounds normal. No respiratory distress. He has no wheezes. He has no rales.  Abdominal: Soft.  Musculoskeletal: He exhibits no tenderness.  Lymphadenopathy:    He has no cervical adenopathy.  Neurological: He is alert and oriented to person, place, and time.  Skin: Skin is warm and dry.   Vitals:   03/19/18 0923  BP: 136/82  Pulse: 67  Temp: 97.7 F (36.5 C)  TempSrc: Oral  SpO2: 97%  Weight: 172 lb (78 kg)  Height: 6' (1.829 m)        Assessment & Plan:  Depo-medrol 40 mg IM given at visit

## 2018-05-19 ENCOUNTER — Other Ambulatory Visit: Payer: Self-pay | Admitting: Internal Medicine

## 2018-05-19 DIAGNOSIS — E1151 Type 2 diabetes mellitus with diabetic peripheral angiopathy without gangrene: Secondary | ICD-10-CM

## 2018-05-28 DIAGNOSIS — R351 Nocturia: Secondary | ICD-10-CM | POA: Diagnosis not present

## 2018-05-28 LAB — PSA: PSA: 1.56

## 2018-06-04 DIAGNOSIS — Z87442 Personal history of urinary calculi: Secondary | ICD-10-CM | POA: Diagnosis not present

## 2018-06-04 DIAGNOSIS — N481 Balanitis: Secondary | ICD-10-CM | POA: Diagnosis not present

## 2018-06-04 DIAGNOSIS — R972 Elevated prostate specific antigen [PSA]: Secondary | ICD-10-CM | POA: Diagnosis not present

## 2018-06-04 DIAGNOSIS — N400 Enlarged prostate without lower urinary tract symptoms: Secondary | ICD-10-CM | POA: Diagnosis not present

## 2018-06-04 DIAGNOSIS — N5201 Erectile dysfunction due to arterial insufficiency: Secondary | ICD-10-CM | POA: Diagnosis not present

## 2018-06-10 ENCOUNTER — Encounter: Payer: Self-pay | Admitting: Internal Medicine

## 2018-06-10 NOTE — Progress Notes (Signed)
Abstracted and sent to scan  

## 2018-06-12 ENCOUNTER — Encounter: Payer: Self-pay | Admitting: Internal Medicine

## 2018-06-12 ENCOUNTER — Ambulatory Visit (INDEPENDENT_AMBULATORY_CARE_PROVIDER_SITE_OTHER): Payer: Medicare Other | Admitting: Internal Medicine

## 2018-06-12 VITALS — BP 132/80 | HR 68 | Temp 97.7°F | Ht 72.0 in | Wt 166.0 lb

## 2018-06-12 DIAGNOSIS — I10 Essential (primary) hypertension: Secondary | ICD-10-CM | POA: Diagnosis not present

## 2018-06-12 DIAGNOSIS — E1151 Type 2 diabetes mellitus with diabetic peripheral angiopathy without gangrene: Secondary | ICD-10-CM | POA: Diagnosis not present

## 2018-06-12 LAB — POCT GLYCOSYLATED HEMOGLOBIN (HGB A1C): HEMOGLOBIN A1C: 6.9 % — AB (ref 4.0–5.6)

## 2018-06-12 MED ORDER — METFORMIN HCL 500 MG PO TABS: 500.0000 mg | ORAL_TABLET | Freq: Two times a day (BID) | ORAL | 3 refills | Status: DC

## 2018-06-12 NOTE — Assessment & Plan Note (Signed)
Taking metformin 500 mg BID and HgA1c checked today in the office 6.9. Those results were shared with him and we discussed how this is at his goal and no change in his regimen is needed. Diet is average but exercise is good and he will keep this up.

## 2018-06-12 NOTE — Progress Notes (Signed)
   Subjective:    Patient ID: Christopher Bradshaw, male    DOB: [birth year], 72 y.o.   MRN: [id number]  HPI The patient is a 72 YO man coming in for follow up of his diabetes (taking metformin 500 mg BID, no side effects, denies diarrhea, denies burning or numbness in hands or feet). He is still walking about 1 hour per day in the morning. Still working and active and busy at work. Denies headaches or migraines. No chest pains or SOB. No abdominal pain or symptoms. Recent visit with the urologist was with clear report. He is still checking sugars in the morning with about 120 fasting daily. He also needs follow up of his blood pressure. Had a headache some weeks ago and was wondering if he was running high. Did not check it at the time. Still taking benazepril daily. Denies side effects from that. No current headache. No recent falls or head injury.   Review of Systems  Constitutional: Negative.   HENT: Negative.   Eyes: Negative.   Respiratory: Negative for cough, chest tightness and shortness of breath.   Cardiovascular: Negative for chest pain, palpitations and leg swelling.  Gastrointestinal: Negative for abdominal distention, abdominal pain, constipation, diarrhea, nausea and vomiting.  Musculoskeletal: Negative.   Skin: Negative.   Neurological: Negative.   Psychiatric/Behavioral: Negative.       Objective:   Physical Exam  Constitutional: He is oriented to person, place, and time. He appears well-developed and well-nourished.  HENT:  Head: Normocephalic and atraumatic.  Eyes: EOM are normal.  Neck: Normal range of motion.  Cardiovascular: Normal rate and regular rhythm.  Pulmonary/Chest: Effort normal and breath sounds normal. No respiratory distress. He has no wheezes. He has no rales.  Abdominal: Soft. Bowel sounds are normal. He exhibits no distension. There is no tenderness. There is no rebound.  Musculoskeletal: He exhibits no edema.  Neurological: He is alert and oriented to  person, place, and time. Coordination normal.  Skin: Skin is warm and dry.  Psychiatric: He has a normal mood and affect.   Vitals:   06/12/18 0928  BP: 132/80  Pulse: 68  Temp: 97.7 F (36.5 C)  TempSrc: Oral  SpO2: 98%  Weight: 166 lb (75.3 kg)  Height: 6' (1.829 m)   POC HgA1c: 6.9    Assessment & Plan:

## 2018-06-12 NOTE — Patient Instructions (Addendum)
The HgA1c is 6.9 today.   Diabetes Mellitus and Standards of Medical Care Managing diabetes (diabetes mellitus) can be complicated. Your diabetes treatment may be managed by a team of health care providers, including:  A diet and nutrition specialist (registered dietitian).  A nurse.  A certified diabetes educator (CDE).  A diabetes specialist (endocrinologist).  An eye doctor.  A primary care provider.  A dentist.  Your health care providers follow a schedule in order to help you get the best quality of care. The following schedule is a general guideline for your diabetes management plan. Your health care providers may also give you more specific instructions. HbA1c ( hemoglobin A1c) test This test provides information about blood sugar (glucose) control over the previous 2-3 months. It is used to check whether your diabetes management plan needs to be adjusted.  If you are meeting your treatment goals, this test is done at least 2 times a year.  If you are not meeting treatment goals or if your treatment goals have changed, this test is done 4 times a year.  Blood pressure test  This test is done at every routine medical visit. For most people, the goal is less than 130/80. Ask your health care provider what your goal blood pressure should be. Dental and eye exams  Visit your dentist two times a year.  If you have type 1 diabetes, get an eye exam 3-5 years after you are diagnosed, and then once a year after your first exam. ? If you were diagnosed with type 1 diabetes as a child, get an eye exam when you are age 18 or older and have had diabetes for 3-5 years. After the first exam, you should get an eye exam once a year.  If you have type 2 diabetes, have an eye exam as soon as you are diagnosed, and then once a year after your first exam. Foot care exam  Visual foot exams are done at every routine medical visit. The exams check for cuts, bruises, redness, blisters, sores,  or other problems with the feet.  A complete foot exam is done by your health care provider once a year. This exam includes an inspection of the structure and skin of your feet, and a check of the pulses and sensation in your feet. ? Type 1 diabetes: Get your first exam 3-5 years after diagnosis. ? Type 2 diabetes: Get your first exam as soon as you are diagnosed.  Check your feet every day for cuts, bruises, redness, blisters, or sores. If you have any of these or other problems that are not healing, contact your health care provider. Kidney function test ( urine microalbumin)  This test is done once a year. ? Type 1 diabetes: Get your first test 5 years after diagnosis. ? Type 2 diabetes: Get your first test as soon as you are diagnosed.  If you have chronic kidney disease (CKD), get a serum creatinine and estimated glomerular filtration rate (eGFR) test once a year. Lipid profile (cholesterol, HDL, LDL, triglycerides)  This test should be done when you are diagnosed with diabetes, and every 5 years after the first test. If you are on medicines to lower your cholesterol, you may need to get this test done every year. ? The goal for LDL is less than 100 mg/dL (5.5 mmol/L). If you are at high risk, the goal is less than 70 mg/dL (3.9 mmol/L). ? The goal for HDL is 40 mg/dL (2.2 mmol/L) for men and  50 mg/dL(2.8 mmol/L) for women. An HDL cholesterol of 60 mg/dL (3.3 mmol/L) or higher gives some protection against heart disease. ? The goal for triglycerides is less than 150 mg/dL (8.3 mmol/L). Immunizations  The yearly flu (influenza) vaccine is recommended for everyone 6 months or older who has diabetes.  The pneumonia (pneumococcal) vaccine is recommended for everyone 2 years or older who has diabetes. If you are 73 or older, you may get the pneumonia vaccine as a series of two separate shots.  The hepatitis B vaccine is recommended for adults shortly after they have been diagnosed with  diabetes.  The Tdap (tetanus, diphtheria, and pertussis) vaccine should be given: ? According to normal childhood vaccination schedules, for children. ? Every 10 years, for adults who have diabetes.  The shingles vaccine is recommended for people who have had chicken pox and are 50 years or older. Mental and emotional health  Screening for symptoms of eating disorders, anxiety, and depression is recommended at the time of diagnosis and afterward as needed. If your screening shows that you have symptoms (you have a positive screening result), you may need further evaluation and be referred to a mental health care provider. Diabetes self-management education  Education about how to manage your diabetes is recommended at diagnosis and ongoing as needed. Treatment plan  Your treatment plan will be reviewed at every medical visit. Summary  Managing diabetes (diabetes mellitus) can be complicated. Your diabetes treatment may be managed by a team of health care providers.  Your health care providers follow a schedule in order to help you get the best quality of care.  Standards of care including having regular physical exams, blood tests, blood pressure monitoring, immunizations, screening tests, and education about how to manage your diabetes.  Your health care providers may also give you more specific instructions based on your individual health. This information is not intended to replace advice given to you by your health care provider. Make sure you discuss any questions you have with your health care provider. Document Released: 09/30/2009 Document Revised: 08/31/2016 Document Reviewed: 08/31/2016 Elsevier Interactive Patient Education  [employer].

## 2018-06-12 NOTE — Assessment & Plan Note (Signed)
Taking benazepril 40 mg daily and continue. Last CMP okay. BP at goal in the office and we discussed possibilities causing prior headache. If recurrent headache we have asked him to check BP.

## 2018-07-21 DIAGNOSIS — Z961 Presence of intraocular lens: Secondary | ICD-10-CM | POA: Diagnosis not present

## 2018-10-07 DIAGNOSIS — Z23 Encounter for immunization: Secondary | ICD-10-CM | POA: Diagnosis not present

## 2018-11-03 ENCOUNTER — Other Ambulatory Visit: Payer: Self-pay

## 2018-11-11 NOTE — Progress Notes (Signed)
Subjective:   Christopher Bradshaw is a 72 y.o. male who presents for Medicare Annual/Subsequent preventive examination.  Review of Systems:  No ROS.  Medicare Wellness Visit. Additional risk factors are reflected in the social history.  Cardiac Risk Factors include: advanced age (>38men, >72 women);diabetes mellitus;dyslipidemia;hypertension;male gender Sleep patterns: feels rested on waking, gets up 1-2 times nightly to void and sleeps 7-8 hours nightly.    Home Safety/Smoke Alarms: Feels safe in home. Smoke alarms in place.  Living environment; residence and Firearm Safety: 1-story house/ trailer, no firearms. Lives with wife, no needs for DME, good support system Seat Belt Safety/Bike Helmet: Wears seat belt.   PSA-  Lab Results  Component Value Date   PSA 1.56 05/28/2018       Objective:    Vitals: BP 126/72   Pulse 64   Resp 18   Ht 6' (1.829 m)   Wt 166 lb (75.3 kg)   SpO2 99%   BMI 22.51 kg/m   Body mass index is 22.51 kg/m.  Advanced Directives 11/12/2018 11/06/2017  Does Patient Have a Medical Advance Directive? No No  Would patient like information on creating a medical advance directive? No - Patient declined Yes (ED - Information included in AVS)    Tobacco Social History   Tobacco Use  Smoking Status Never Smoker  Smokeless Tobacco Never Used     Counseling given: Not Answered  Past Medical History:  Diagnosis Date  . Allergic rhinitis   . BPH (benign prostatic hypertrophy)    Dr Jeffie Pollock  . Diabetes mellitus without complication ([place])    resolved with TLC  . Femoral bruit   . Gilbert syndrome    total bilirubin 1.5 in 2006  . Hyperglycemia   . Hyperlipidemia   . Hypertension   . Nephrolithiasis    calcium oxalate stone  . RBBB (right bundle branch block)   . Thrombophlebitis 09/2004  . TIA (transient ischemic attack) 2015  . Varicose vein    right lower extremity   Past Surgical History:  Procedure Laterality Date  . cataract surgery  Bilateral 03/05/2018  . COLONOSCOPY  2000, 1962,2297   negative X 3;Dr  Delfin Edis  . CYSTOSCOPY     for hematuria,urethritis 2004, Dr Serita Butcher  . lithrotripsy     for kidney stones  . NASAL SEPTUM SURGERY     deviation correction  . Renal calculi lithotripsy     x 1,residual L renal calculus, spontaneous passage 10-12 x  . TONSILLECTOMY     Family History  Problem Relation Age of Onset  . Lung cancer Father        smoker  . Alzheimer's disease Father   . Colon cancer Paternal Aunt   . Ovarian cancer Paternal Aunt   . Emphysema Paternal Grandfather   . Colon cancer Paternal Grandfather   . Diabetes Neg Hx   . Heart disease Neg Hx   . Stroke Neg Hx    Social History   Socioeconomic History  . Marital status: Married    Spouse name: Not on file  . Number of children: 2  . Years of education: Not on file  . Highest education level: Not on file  Occupational History  . Occupation: [employer], part time   Social Needs  . Financial resource strain: Not hard at all  . Food insecurity:    Worry: Never true    Inability: Never true  . Transportation needs:    Medical: No  Non-medical: No  Tobacco Use  . Smoking status: Never Smoker  . Smokeless tobacco: Never Used  Substance and Sexual Activity  . Alcohol use: Yes    Comment:  very rarely  . Drug use: No  . Sexual activity: Not Currently  Lifestyle  . Physical activity:    Days per week: 6 days    Minutes per session: 50 min  . Stress: Not at all  Relationships  . Social connections:    Talks on phone: More than three times a week    Gets together: More than three times a week    Attends religious service: More than 4 times per year    Active member of club or organization: Yes    Attends meetings of clubs or organizations: More than 4 times per year    Relationship status: Married  Other Topics Concern  . Not on file  Social History Narrative   Regular exercise- yes walks once daily 60 min w/o  symtoms           Outpatient Encounter Medications as of 11/12/2018  Medication Sig  . acetaminophen (TYLENOL) 325 MG tablet Take 650 mg by mouth every 6 (six) hours as needed.  Marland Kitchen aspirin 81 MG tablet Take 81 mg by mouth daily.    . benazepril (LOTENSIN) 40 MG tablet Take 1 tablet (40 mg total) by mouth daily.  Marland Kitchen glucose blood (FREESTYLE LITE) test strip USE TO TEST BLOOD SUGAR ONCE DAILY ICD E11 59  . Lancets (FREESTYLE) lancets USE TO CHECK BLOOD SUGAR ONCE DAILY ICD E11 59  . metFORMIN (GLUCOPHAGE) 500 MG tablet Take 1 tablet (500 mg total) by mouth 2 (two) times daily with a meal.  . Omega-3 Fatty Acids (FISH OIL) 1000 MG CAPS Take by mouth.    . simvastatin (ZOCOR) 40 MG tablet Take 1 tablet (40 mg total) by mouth at bedtime.  . vitamin E 100 UNIT capsule Take 400 Units by mouth daily.    No facility-administered encounter medications on file as of 11/12/2018.     Activities of Daily Living In your present state of health, do you have any difficulty performing the following activities: 11/12/2018  Hearing? N  Vision? N  Difficulty concentrating or making decisions? N  Walking or climbing stairs? N  Dressing or bathing? N  Doing errands, shopping? N  Preparing Food and eating ? N  Using the Toilet? N  In the past six months, have you accidently leaked urine? N  Do you have problems with loss of bowel control? N  Managing your Medications? N  Managing your Finances? N  Housekeeping or managing your Housekeeping? N  Some recent data might be hidden    Patient Care Team: Hoyt Koch, MD as PCP - General (Internal Medicine)   Assessment:   This is a routine wellness examination for [place]. Physical assessment deferred to PCP.   Exercise Activities and Dietary recommendations Current Exercise Habits: Home exercise routine, Type of exercise: walking, Time (Minutes): 55, Frequency (Times/Week): 6, Weekly Exercise (Minutes/Week): 330, Intensity: Mild, Exercise  limited by: None identified  Diet (meal preparation, eat out, water intake, caffeinated beverages, dairy products, fruits and vegetables): in general, a "healthy" diet  , well balanced eats a variety of fruits and vegetables daily, limits salt, fat/cholesterol, sugar,carbohydrates,caffeine, drinks 6-8 glasses of water daily.  Goals    . Patient Stated     Maintain current health status, stay as active and as independent as possible.    Marland Kitchen  Patient Stated     I want to maintain my health status, continue to walk and eat healthy. Enjoy life and family.       Fall Risk Fall Risk  11/12/2018 11/06/2017 07/15/2017 04/03/2016 01/04/2015  Falls in the past year? 0 No No No No  Comment - - Emmi Telephone Survey: data to providers prior to load - -    Depression Screen PHQ 2/9 Scores 11/12/2018 11/06/2017 11/06/2017 04/03/2016  PHQ - 2 Score 0 0 0 0  PHQ- 9 Score - 0 - -    Cognitive Function MMSE - Mini Mental State Exam 11/12/2018  Orientation to time 5  Orientation to Place 5  Registration 3  Attention/ Calculation 4  Recall 2  Language- name 2 objects 2  Language- repeat 1  Language- follow 3 step command 3  Language- read & follow direction 1  Write a sentence 1  Copy design 1  Total score 28        Immunization History  Administered Date(s) Administered  . Influenza Split 09/18/2012  . Influenza, High Dose Seasonal PF 09/29/2013, 08/31/2016, 10/07/2018  . Influenza,inj,Quad PF,6+ Mos 10/08/2014, 10/21/2015  . Influenza-Unspecified 09/25/2017  . Pneumococcal Conjugate-13 10/20/2015  . Pneumococcal Polysaccharide-23 07/20/2011  . Td 01/25/2009   Screening Tests Health Maintenance  Topic Date Due  . Hepatitis C Screening  1946-09-29  . OPHTHALMOLOGY EXAM  10/18/2018  . FOOT EXAM  11/06/2018  . HEMOGLOBIN A1C  12/12/2018  . TETANUS/TDAP  01/25/2019  . COLONOSCOPY  10/29/2023  . INFLUENZA VACCINE  Completed  . PNA vac Low Risk Adult  Completed      Plan:      Continue doing brain stimulating activities (puzzles, reading, adult coloring books, staying active) to keep memory sharp.   Continue to eat heart healthy diet (full of fruits, vegetables, whole grains, lean protein, water--limit salt, fat, and sugar intake) and increase physical activity as tolerated.   I have personally reviewed and noted the following in the patient's chart:   . Medical and social history . Use of alcohol, tobacco or illicit drugs  . Current medications and supplements . Functional ability and status . Nutritional status . Physical activity . Advanced directives . List of other physicians . Vitals . Screenings to include cognitive, depression, and falls . Referrals and appointments  In addition, I have reviewed and discussed with patient certain preventive protocols, quality metrics, and best practice recommendations. A written personalized care plan for preventive services as well as general preventive health recommendations were provided to patient.     Michiel Cowboy, RN  11/12/2018

## 2018-11-12 ENCOUNTER — Ambulatory Visit (INDEPENDENT_AMBULATORY_CARE_PROVIDER_SITE_OTHER): Payer: Medicare Other | Admitting: *Deleted

## 2018-11-12 VITALS — BP 126/72 | HR 64 | Resp 18 | Ht 72.0 in | Wt 166.0 lb

## 2018-11-12 DIAGNOSIS — E1151 Type 2 diabetes mellitus with diabetic peripheral angiopathy without gangrene: Secondary | ICD-10-CM

## 2018-11-12 DIAGNOSIS — Z0000 Encounter for general adult medical examination without abnormal findings: Secondary | ICD-10-CM | POA: Diagnosis not present

## 2018-11-12 NOTE — Patient Instructions (Addendum)
Continue doing brain stimulating activities (puzzles, reading, adult coloring books, staying active) to keep memory sharp.   Continue to eat heart healthy diet (full of fruits, vegetables, whole grains, lean protein, water--limit salt, fat, and sugar intake) and increase physical activity as tolerated.   Christopher Bradshaw , Thank you for taking time to come for your Medicare Wellness Visit. I appreciate your ongoing commitment to your health goals. Please review the following plan we discussed and let me know if I can assist you in the future.   These are the goals we discussed: Goals    . Patient Stated     Maintain current health status, stay as active and as independent as possible.    . Patient Stated     I want maintain my health status, continue to walk and eat healthy. Enjoy life and family.       This is a list of the screening recommended for you and due dates:  Health Maintenance  Topic Date Due  .  Hepatitis C: One time screening is recommended by Center for Disease Control  (CDC) for  adults born from [birth year] through 1967.   06-16-1946  . Eye exam for diabetics  10/18/2018  . Complete foot exam   11/06/2018  . Hemoglobin A1C  12/12/2018  . Tetanus Vaccine  01/25/2019  . Colon Cancer Screening  10/29/2023  . Flu Shot  Completed  . Pneumonia vaccines  Completed    Health Maintenance, Male A healthy lifestyle and preventive care is important for your health and wellness. Ask your health care provider about what schedule of regular examinations is right for you. What should I know about weight and diet? Eat a Healthy Diet  Eat plenty of vegetables, fruits, whole grains, low-fat dairy products, and lean protein.  Do not eat a lot of foods high in solid fats, added sugars, or salt.  Maintain a Healthy Weight Regular exercise can help you achieve or maintain a healthy weight. You should:  Do at least 150 minutes of exercise each week. The exercise should increase your heart rate  and make you sweat (moderate-intensity exercise).  Do strength-training exercises at least twice a week.  Watch Your Levels of Cholesterol and Blood Lipids  Have your blood tested for lipids and cholesterol every 5 years starting at 74 years of age. If you are at high risk for heart disease, you should start having your blood tested when you are 74 years old. You may need to have your cholesterol levels checked more often if: ? Your lipid or cholesterol levels are high. ? You are older than 74 years of age. ? You are at high risk for heart disease.  What should I know about cancer screening? Many types of cancers can be detected early and may often be prevented. Lung Cancer  You should be screened every year for lung cancer if: ? You are a current smoker who has smoked for at least 30 years. ? You are a former smoker who has quit within the past 15 years.  Talk to your health care provider about your screening options, when you should start screening, and how often you should be screened.  Colorectal Cancer  Routine colorectal cancer screening usually begins at 74 years of age and should be repeated every 5-10 years until you are 74 years old. You may need to be screened more often if early forms of precancerous polyps or small growths are found. Your health care provider may recommend screening  at an earlier age if you have risk factors for colon cancer.  Your health care provider may recommend using home test kits to check for hidden blood in the stool.  A small camera at the end of a tube can be used to examine your colon (sigmoidoscopy or colonoscopy). This checks for the earliest forms of colorectal cancer.  Prostate and Testicular Cancer  Depending on your age and overall health, your health care provider may do certain tests to screen for prostate and testicular cancer.  Talk to your health care provider about any symptoms or concerns you have about testicular or prostate  cancer.  Skin Cancer  Check your skin from head to toe regularly.  Tell your health care provider about any new moles or changes in moles, especially if: ? There is a change in a mole's size, shape, or color. ? You have a mole that is larger than a pencil eraser.  Always use sunscreen. Apply sunscreen liberally and repeat throughout the day.  Protect yourself by wearing long sleeves, pants, a wide-brimmed hat, and sunglasses when outside.  What should I know about heart disease, diabetes, and high blood pressure?  If you are 54-52 years of age, have your blood pressure checked every 3-5 years. If you are 74 years of age or older, have your blood pressure checked every year. You should have your blood pressure measured twice-once when you are at a hospital or clinic, and once when you are not at a hospital or clinic. Record the average of the two measurements. To check your blood pressure when you are not at a hospital or clinic, you can use: ? An automated blood pressure machine at a pharmacy. ? A home blood pressure monitor.  Talk to your health care provider about your target blood pressure.  If you are between 34-55 years old, ask your health care provider if you should take aspirin to prevent heart disease.  Have regular diabetes screenings by checking your fasting blood sugar level. ? If you are at a normal weight and have a low risk for diabetes, have this test once every three years after the age of 27. ? If you are overweight and have a high risk for diabetes, consider being tested at a younger age or more often.  A one-time screening for abdominal aortic aneurysm (AAA) by ultrasound is recommended for men aged 88-75 years who are current or former smokers. What should I know about preventing infection? Hepatitis B If you have a higher risk for hepatitis B, you should be screened for this virus. Talk with your health care provider to find out if you are at risk for hepatitis B  infection. Hepatitis C Blood testing is recommended for:  Everyone born from [birth year] through 1965.  Anyone with known risk factors for hepatitis C.  Sexually Transmitted Diseases (STDs)  You should be screened each year for STDs including gonorrhea and chlamydia if: ? You are sexually active and are younger than 74 years of age. ? You are older than 74 years of age and your health care provider tells you that you are at risk for this type of infection. ? Your sexual activity has changed since you were last screened and you are at an increased risk for chlamydia or gonorrhea. Ask your health care provider if you are at risk.  Talk with your health care provider about whether you are at high risk of being infected with HIV. Your health care provider may recommend a  prescription medicine to help prevent HIV infection.  What else can I do?  Schedule regular health, dental, and eye exams.  Stay current with your vaccines (immunizations).  Do not use any tobacco products, such as cigarettes, chewing tobacco, and e-cigarettes. If you need help quitting, ask your health care provider.  Limit alcohol intake to no more than 2 drinks per day. One drink equals 12 ounces of beer, 5 ounces of , or 1 ounces of hard liquor.  Do not use street drugs.  Do not share needles.  Ask your health care provider for help if you need support or information about quitting drugs.  Tell your health care provider if you often feel depressed.  Tell your health care provider if you have ever been abused or do not feel safe at home. This information is not intended to replace advice given to you by your health care provider. Make sure you discuss any questions you have with your health care provider. Document Released: 05/31/2008 Document Revised: 08/01/2016 Document Reviewed: 09/06/2015 Elsevier Interactive Patient Education  [employer].

## 2018-11-12 NOTE — Progress Notes (Signed)
Medical screening examination/treatment/procedure(s) were performed by non-physician practitioner and as supervising physician I was immediately available for consultation/collaboration. I agree with above. Ervin Rothbauer A Lailana Shira, MD 

## 2018-12-01 ENCOUNTER — Other Ambulatory Visit: Payer: Self-pay | Admitting: Internal Medicine

## 2018-12-01 ENCOUNTER — Other Ambulatory Visit (INDEPENDENT_AMBULATORY_CARE_PROVIDER_SITE_OTHER): Payer: Medicare Other

## 2018-12-01 ENCOUNTER — Ambulatory Visit (INDEPENDENT_AMBULATORY_CARE_PROVIDER_SITE_OTHER): Payer: Medicare Other | Admitting: Internal Medicine

## 2018-12-01 ENCOUNTER — Other Ambulatory Visit: Payer: Self-pay

## 2018-12-01 ENCOUNTER — Encounter: Payer: Self-pay | Admitting: Internal Medicine

## 2018-12-01 VITALS — BP 118/80 | HR 68 | Temp 97.7°F | Ht 72.0 in | Wt 166.0 lb

## 2018-12-01 DIAGNOSIS — E785 Hyperlipidemia, unspecified: Secondary | ICD-10-CM

## 2018-12-01 DIAGNOSIS — I10 Essential (primary) hypertension: Secondary | ICD-10-CM

## 2018-12-01 DIAGNOSIS — E1151 Type 2 diabetes mellitus with diabetic peripheral angiopathy without gangrene: Secondary | ICD-10-CM

## 2018-12-01 DIAGNOSIS — E1169 Type 2 diabetes mellitus with other specified complication: Secondary | ICD-10-CM | POA: Diagnosis not present

## 2018-12-01 LAB — COMPREHENSIVE METABOLIC PANEL
ALBUMIN: 4.3 g/dL (ref 3.5–5.2)
ALT: 14 U/L (ref 0–53)
AST: 17 U/L (ref 0–37)
Alkaline Phosphatase: 43 U/L (ref 39–117)
BUN: 19 mg/dL (ref 6–23)
CO2: 28 mEq/L (ref 19–32)
Calcium: 9.5 mg/dL (ref 8.4–10.5)
Chloride: 103 mEq/L (ref 96–112)
Creatinine, Ser: 0.75 mg/dL (ref 0.40–1.50)
GFR: 107.97 mL/min (ref >60.00)
Glucose, Bld: 116 mg/dL — ABNORMAL HIGH (ref 70–99)
POTASSIUM: 4.7 meq/L (ref 3.5–5.1)
SODIUM: 138 meq/L (ref 135–145)
Total Bilirubin: 0.9 mg/dL (ref 0.2–1.2)
Total Protein: 6.6 g/dL (ref 6.0–8.3)

## 2018-12-01 LAB — LIPID PANEL
CHOLESTEROL: 146 mg/dL (ref 0–200)
HDL: 50.9 mg/dL (ref >39.00)
LDL Cholesterol: 82 mg/dL (ref 0–99)
NonHDL: 95.58
Total CHOL/HDL Ratio: 3
Triglycerides: 70 mg/dL (ref 0.0–149.0)
VLDL: 14 mg/dL (ref 0.0–40.0)

## 2018-12-01 LAB — CBC
HEMATOCRIT: 45.1 % (ref 39.0–52.0)
Hemoglobin: 15.2 g/dL (ref 13.0–17.0)
MCHC: 33.7 g/dL (ref 30.0–36.0)
MCV: 91.4 fl (ref 78.0–100.0)
Platelets: 132 10*3/uL — ABNORMAL LOW (ref 150.0–400.0)
RBC: 4.93 Mil/uL (ref 4.22–5.81)
RDW: 13.1 % (ref 11.5–15.5)
WBC: 7.4 10*3/uL (ref 4.0–10.5)

## 2018-12-01 LAB — HEMOGLOBIN A1C: Hgb A1c MFr Bld: 6.9 % — ABNORMAL HIGH (ref 4.6–6.5)

## 2018-12-01 MED ORDER — BENAZEPRIL HCL 40 MG PO TABS: 40.0000 mg | ORAL_TABLET | Freq: Every day | ORAL | 3 refills | Status: DC

## 2018-12-01 MED ORDER — SIMVASTATIN 40 MG PO TABS: 40.0000 mg | ORAL_TABLET | Freq: Every day | ORAL | 3 refills | Status: DC

## 2018-12-01 NOTE — Telephone Encounter (Signed)
Copied from [place] [id number]. Topic: Quick Communication - Rx Refill/Question >> Dec 01, 2018  2:48 PM Cecelia Byars, NT wrote: Medication:   benazepril (LOTENSIN) 40 MG tablet   Has the patient contacted their pharmacy? yes (Agent: If no, request that the patient contact the pharmacy for the refill. (Agent: If yes, when and what did the pharmacy advise?  Preferred Pharmacy (with phone number or street name [place], [place] - [place] [phone] (Phone) [phone] (Fax)    Agent: Please be advised that RX refills may take up to 3 business days. We ask that you follow-up with your pharmacy.

## 2018-12-01 NOTE — Assessment & Plan Note (Signed)
Checking HgA1c, eye exam up to date. Taking metformin and adjust as needed. On ACE-I and statin.

## 2018-12-01 NOTE — Progress Notes (Signed)
   Subjective:    Patient ID: Christopher Bradshaw, male    DOB: [birth year], 72 y.o.   MRN: [id number]  HPI The patient is a 72 YO man coming in for diabetes (taking metformin, on ACE-I and statin, denies new numbness or weakness in feet, still walking 1 hour daily when able, tries to control diet, overall well controlled) and blood pressure (taking benazepril and BP at goal, denies chest pains or headaches, denies side effects) and cholesterol (taking simvastatin, denies side effects, denies new heart attack or stroke symptoms). Denies new concerns.   Review of Systems  Constitutional: Negative.   HENT: Negative.   Eyes: Negative.   Respiratory: Negative for cough, chest tightness and shortness of breath.   Cardiovascular: Negative for chest pain, palpitations and leg swelling.  Gastrointestinal: Negative for abdominal distention, abdominal pain, constipation, diarrhea, nausea and vomiting.  Musculoskeletal: Negative.   Skin: Negative.   Neurological: Negative.   Psychiatric/Behavioral: Negative.       Objective:   Physical Exam Constitutional:      Appearance: He is well-developed.  HENT:     Head: Normocephalic and atraumatic.  Neck:     Musculoskeletal: Normal range of motion.  Cardiovascular:     Rate and Rhythm: Normal rate and regular rhythm.  Pulmonary:     Effort: Pulmonary effort is normal. No respiratory distress.     Breath sounds: Normal breath sounds. No wheezing or rales.  Abdominal:     General: Bowel sounds are normal. There is no distension.     Palpations: Abdomen is soft.     Tenderness: There is no abdominal tenderness. There is no rebound.  Skin:    General: Skin is warm and dry.  Neurological:     Mental Status: He is alert and oriented to person, place, and time.     Coordination: Coordination normal.    Vitals:   12/01/18 0906  BP: 118/80  Pulse: 68  Temp: 97.7 F (36.5 C)  TempSrc: Oral  SpO2: 98%  Weight: 166 lb (75.3 kg)  Height: 6' (1.829 m)       Assessment & Plan:

## 2018-12-01 NOTE — Assessment & Plan Note (Signed)
Checking CMP and adjust benazepril as needed. BP at goal today.

## 2018-12-01 NOTE — Telephone Encounter (Signed)
Copied from [place] [id number]. Topic: General - Other >> Dec 01, 2018  2:50 PM Cecelia Byars, NT wrote: Reason for CRM: Patients wife called and is requesting that all of his prescriptions get faxed to the [place], [place] - [place] [phone] (Phone) [phone] (Fax) to make sure they have them on file

## 2018-12-01 NOTE — Assessment & Plan Note (Signed)
Checking lipid panel and adjust for LDL <100. Taking simvastatin 40 mg daily.

## 2018-12-01 NOTE — Patient Instructions (Signed)
Diabetes Mellitus and Standards of Medical Care Managing diabetes (diabetes mellitus) can be complicated. Your diabetes treatment may be managed by a team of health care providers, including:  A diet and nutrition specialist (registered dietitian).  A nurse.  A certified diabetes educator (CDE).  A diabetes specialist (endocrinologist).  An eye doctor.  A primary care provider.  A dentist.  Your health care providers follow a schedule in order to help you get the best quality of care. The following schedule is a general guideline for your diabetes management plan. Your health care providers may also give you more specific instructions. HbA1c ( hemoglobin A1c) test This test provides information about blood sugar (glucose) control over the previous 2-3 months. It is used to check whether your diabetes management plan needs to be adjusted.  If you are meeting your treatment goals, this test is done at least 2 times a year.  If you are not meeting treatment goals or if your treatment goals have changed, this test is done 4 times a year.  Blood pressure test  This test is done at every routine medical visit. For most people, the goal is less than 130/80. Ask your health care provider what your goal blood pressure should be. Dental and eye exams  Visit your dentist two times a year.  If you have type 1 diabetes, get an eye exam 3-5 years after you are diagnosed, and then once a year after your first exam. ? If you were diagnosed with type 1 diabetes as a child, get an eye exam when you are age 16 or older and have had diabetes for 3-5 years. After the first exam, you should get an eye exam once a year.  If you have type 2 diabetes, have an eye exam as soon as you are diagnosed, and then once a year after your first exam. Foot care exam  Visual foot exams are done at every routine medical visit. The exams check for cuts, bruises, redness, blisters, sores, or other problems with the  feet.  A complete foot exam is done by your health care provider once a year. This exam includes an inspection of the structure and skin of your feet, and a check of the pulses and sensation in your feet. ? Type 1 diabetes: Get your first exam 3-5 years after diagnosis. ? Type 2 diabetes: Get your first exam as soon as you are diagnosed.  Check your feet every day for cuts, bruises, redness, blisters, or sores. If you have any of these or other problems that are not healing, contact your health care provider. Kidney function test ( urine microalbumin)  This test is done once a year. ? Type 1 diabetes: Get your first test 5 years after diagnosis. ? Type 2 diabetes: Get your first test as soon as you are diagnosed.  If you have chronic kidney disease (CKD), get a serum creatinine and estimated glomerular filtration rate (eGFR) test once a year. Lipid profile (cholesterol, HDL, LDL, triglycerides)  This test should be done when you are diagnosed with diabetes, and every 5 years after the first test. If you are on medicines to lower your cholesterol, you may need to get this test done every year. ? The goal for LDL is less than 100 mg/dL (5.5 mmol/L). If you are at high risk, the goal is less than 70 mg/dL (3.9 mmol/L). ? The goal for HDL is 40 mg/dL (2.2 mmol/L) for men and 50 mg/dL(2.8 mmol/L) for women. An HDL  cholesterol of 60 mg/dL (3.3 mmol/L) or higher gives some protection against heart disease. ? The goal for triglycerides is less than 150 mg/dL (8.3 mmol/L). Immunizations  The yearly flu (influenza) vaccine is recommended for everyone 6 months or older who has diabetes.  The pneumonia (pneumococcal) vaccine is recommended for everyone 2 years or older who has diabetes. If you are 97 or older, you may get the pneumonia vaccine as a series of two separate shots.  The hepatitis B vaccine is recommended for adults shortly after they have been diagnosed with diabetes.  The Tdap  (tetanus, diphtheria, and pertussis) vaccine should be given: ? According to normal childhood vaccination schedules, for children. ? Every 10 years, for adults who have diabetes.  The shingles vaccine is recommended for people who have had chicken pox and are 50 years or older. Mental and emotional health  Screening for symptoms of eating disorders, anxiety, and depression is recommended at the time of diagnosis and afterward as needed. If your screening shows that you have symptoms (you have a positive screening result), you may need further evaluation and be referred to a mental health care provider. Diabetes self-management education  Education about how to manage your diabetes is recommended at diagnosis and ongoing as needed. Treatment plan  Your treatment plan will be reviewed at every medical visit. Summary  Managing diabetes (diabetes mellitus) can be complicated. Your diabetes treatment may be managed by a team of health care providers.  Your health care providers follow a schedule in order to help you get the best quality of care.  Standards of care including having regular physical exams, blood tests, blood pressure monitoring, immunizations, screening tests, and education about how to manage your diabetes.  Your health care providers may also give you more specific instructions based on your individual health. This information is not intended to replace advice given to you by your health care provider. Make sure you discuss any questions you have with your health care provider. Document Released: 09/30/2009 Document Revised: 08/31/2016 Document Reviewed: 08/31/2016 Elsevier Interactive Patient Education  [employer].

## 2019-03-11 ENCOUNTER — Telehealth: Payer: Self-pay

## 2019-03-11 NOTE — Telephone Encounter (Signed)
Patient called, left VM to return call to the office for questions that will need to be answered before the appointment.

## 2019-03-11 NOTE — Telephone Encounter (Signed)
Phone call returned to pt.  Advised of necessity to do pre-appt. Screening questions, prior to 3/26 appt.  Pt. Denied any ill feeling; denied cough, SOB, fever, diarrhea or vomiting.  Reported he tried to [place] last week, and felt eyes got watery and itchy, so waiting on allergy shot.  Denied any other complaints.  Advised to report for appt. At 8:25 AM.  Will forward to PCP office.  Agreed with plan.

## 2019-03-11 NOTE — Telephone Encounter (Signed)
Patient returning call from [place] regarding pre-appointment screening.

## 2019-03-11 NOTE — Telephone Encounter (Signed)
LVM for patient to call back and let us know if they are having any upper respiratory symptoms such as cough, SOB, trouble breathing, fever, diarrhea or vomiting.

## 2019-03-11 NOTE — Telephone Encounter (Signed)
Noted  

## 2019-03-12 ENCOUNTER — Ambulatory Visit (INDEPENDENT_AMBULATORY_CARE_PROVIDER_SITE_OTHER): Payer: Medicare Other | Admitting: Internal Medicine

## 2019-03-12 ENCOUNTER — Other Ambulatory Visit: Payer: Self-pay

## 2019-03-12 ENCOUNTER — Encounter: Payer: Self-pay | Admitting: Internal Medicine

## 2019-03-12 VITALS — BP 168/108 | HR 74 | Temp 97.5°F | Ht 72.0 in | Wt 172.0 lb

## 2019-03-12 DIAGNOSIS — J301 Allergic rhinitis due to pollen: Secondary | ICD-10-CM | POA: Diagnosis not present

## 2019-03-12 MED ORDER — METHYLPREDNISOLONE ACETATE 40 MG/ML IJ SUSP
40.0000 mg | Freq: Once | INTRAMUSCULAR | Status: AC
  Administered 2019-03-12: 40 mg via INTRAMUSCULAR

## 2019-03-12 NOTE — Assessment & Plan Note (Signed)
Advised to switch to zyrtec and given depo-medrol 40 mg IM today. Talked about risks and benefits and risk are overall low given he gets this once per year. Has no symptoms of acute URI or coronavirus or flu at this time.

## 2019-03-12 NOTE — Patient Instructions (Signed)
We have given you the shot today for the allergies.   Consider switching to zyrtec (cetirizine).

## 2019-03-12 NOTE — Progress Notes (Signed)
   Subjective:   Patient ID: Christopher Bradshaw, male    DOB: 1946-09-18, 73 y.o.   MRN: [id number]  HPI The patient is a 73 YO man coming in for allergies. Denies fevers or chills. Denies cough or SOB. Having nasal congestion and drainage. Does typically get this in the spring and this is on schedule. He has bad allergies to the pollen and does walk outdoors daily for exercise.  Has gotten steroid injection in the past on bad years and this typically carries him through the season without infection or problem. Usually symptoms are resolved by mid-June. He does take claritin daily.   Review of Systems  Constitutional: Negative.   HENT: Positive for congestion, postnasal drip and rhinorrhea. Negative for sinus pressure, sinus pain, tinnitus, trouble swallowing and voice change.   Eyes: Positive for itching.  Respiratory: Negative for cough, chest tightness and shortness of breath.   Cardiovascular: Negative for chest pain, palpitations and leg swelling.  Gastrointestinal: Negative for abdominal distention, abdominal pain, constipation, diarrhea, nausea and vomiting.  Musculoskeletal: Negative.   Skin: Negative.   Neurological: Negative.   Psychiatric/Behavioral: Negative.     Objective:  Physical Exam Constitutional:      Appearance: He is well-developed.  HENT:     Head: Normocephalic and atraumatic.     Comments: Some watering eyes on exam, no discharge or drainage, mild post nasal drip clear.  Neck:     Musculoskeletal: Normal range of motion.  Cardiovascular:     Rate and Rhythm: Normal rate and regular rhythm.  Pulmonary:     Effort: Pulmonary effort is normal. No respiratory distress.     Breath sounds: Normal breath sounds. No wheezing or rales.  Abdominal:     General: Bowel sounds are normal. There is no distension.     Palpations: Abdomen is soft.     Tenderness: There is no abdominal tenderness. There is no rebound.  Skin:    General: Skin is warm and dry.  Neurological:     Mental Status: He is alert and oriented to person, place, and time.     Coordination: Coordination normal.     Vitals:   03/12/19 0823  BP: (!) 168/108  Pulse: 74  Temp: (!) 97.5 F (36.4 C)  TempSrc: Oral  SpO2: 99%  Weight: 172 lb (78 kg)  Height: 6' (1.829 m)    Assessment & Plan:  Depo-medrol 40 mg IM given at visit

## 2019-06-03 ENCOUNTER — Other Ambulatory Visit (INDEPENDENT_AMBULATORY_CARE_PROVIDER_SITE_OTHER): Payer: Medicare Other

## 2019-06-03 ENCOUNTER — Ambulatory Visit (INDEPENDENT_AMBULATORY_CARE_PROVIDER_SITE_OTHER): Payer: Medicare Other | Admitting: Internal Medicine

## 2019-06-03 ENCOUNTER — Other Ambulatory Visit: Payer: Self-pay

## 2019-06-03 ENCOUNTER — Encounter: Payer: Self-pay | Admitting: Internal Medicine

## 2019-06-03 VITALS — BP 158/90 | HR 69 | Temp 97.7°F | Ht 72.0 in | Wt 167.0 lb

## 2019-06-03 DIAGNOSIS — E1151 Type 2 diabetes mellitus with diabetic peripheral angiopathy without gangrene: Secondary | ICD-10-CM

## 2019-06-03 DIAGNOSIS — Z23 Encounter for immunization: Secondary | ICD-10-CM | POA: Diagnosis not present

## 2019-06-03 LAB — HEMOGLOBIN A1C: Hgb A1c MFr Bld: 7.3 % — ABNORMAL HIGH (ref 4.6–6.5)

## 2019-06-03 MED ORDER — FREESTYLE LITE TEST VI STRP: ORAL_STRIP | 11 refills | Status: DC

## 2019-06-03 MED ORDER — FREESTYLE LANCETS MISC: 11 refills | Status: DC

## 2019-06-03 NOTE — Progress Notes (Signed)
   Subjective:   Patient ID: Christopher Bradshaw, male    DOB: 1946/09/16, 73 y.o.   MRN: [id number]  HPI The patient is a 73 YO man coming in for follow up of his diabetes. He is doing well with the pandemic and he has still been exercising. He does walk daily. Denies new thirst or excessive urination. Denies new numbness or tingling. No swelling in his legs. Denies headaches or chest pains or SOB. Denies fevers or chills. BP normal at home 120s/60s.  Review of Systems  Constitutional: Negative.   HENT: Negative.   Eyes: Negative.   Respiratory: Negative for cough, chest tightness and shortness of breath.   Cardiovascular: Negative for chest pain, palpitations and leg swelling.  Gastrointestinal: Negative for abdominal distention, abdominal pain, constipation, diarrhea, nausea and vomiting.  Musculoskeletal: Negative.   Skin: Negative.   Neurological: Negative.   Psychiatric/Behavioral: Negative.     Objective:  Physical Exam Constitutional:      Appearance: He is well-developed.  HENT:     Head: Normocephalic and atraumatic.  Neck:     Musculoskeletal: Normal range of motion.  Cardiovascular:     Rate and Rhythm: Normal rate and regular rhythm.  Pulmonary:     Effort: Pulmonary effort is normal. No respiratory distress.     Breath sounds: Normal breath sounds. No wheezing or rales.  Abdominal:     General: Bowel sounds are normal. There is no distension.     Palpations: Abdomen is soft.     Tenderness: There is no abdominal tenderness. There is no rebound.  Skin:    General: Skin is warm and dry.  Neurological:     Mental Status: He is alert and oriented to person, place, and time.     Coordination: Coordination normal.     Vitals:   06/03/19 0925  BP: (!) 158/90  Pulse: 69  Temp: 97.7 F (36.5 C)  TempSrc: Oral  SpO2: 98%  Weight: 167 lb (75.8 kg)  Height: 6' (1.829 m)    Assessment & Plan:  Tdap given at visit

## 2019-06-03 NOTE — Assessment & Plan Note (Addendum)
Checking HgA1c and adjust metformin as needed. Taking ACE-I and statin as well. Complicated by hyperlipidemia and PVD. Refills for glucose meter done today. Tdap updated.

## 2019-06-08 DIAGNOSIS — N401 Enlarged prostate with lower urinary tract symptoms: Secondary | ICD-10-CM | POA: Diagnosis not present

## 2019-06-08 DIAGNOSIS — N5201 Erectile dysfunction due to arterial insufficiency: Secondary | ICD-10-CM | POA: Diagnosis not present

## 2019-06-08 DIAGNOSIS — Z87442 Personal history of urinary calculi: Secondary | ICD-10-CM | POA: Diagnosis not present

## 2019-06-08 DIAGNOSIS — R3915 Urgency of urination: Secondary | ICD-10-CM | POA: Diagnosis not present

## 2019-07-16 ENCOUNTER — Other Ambulatory Visit: Payer: Self-pay | Admitting: Internal Medicine

## 2019-07-16 DIAGNOSIS — E1151 Type 2 diabetes mellitus with diabetic peripheral angiopathy without gangrene: Secondary | ICD-10-CM

## 2019-07-23 DIAGNOSIS — E119 Type 2 diabetes mellitus without complications: Secondary | ICD-10-CM | POA: Diagnosis not present

## 2019-07-23 DIAGNOSIS — H524 Presbyopia: Secondary | ICD-10-CM | POA: Diagnosis not present

## 2019-07-23 DIAGNOSIS — Z961 Presence of intraocular lens: Secondary | ICD-10-CM | POA: Diagnosis not present

## 2019-07-23 DIAGNOSIS — Z7984 Long term (current) use of oral hypoglycemic drugs: Secondary | ICD-10-CM | POA: Diagnosis not present

## 2019-09-16 DIAGNOSIS — Z23 Encounter for immunization: Secondary | ICD-10-CM | POA: Diagnosis not present

## 2019-10-26 DIAGNOSIS — D224 Melanocytic nevi of scalp and neck: Secondary | ICD-10-CM | POA: Diagnosis not present

## 2019-10-26 DIAGNOSIS — L821 Other seborrheic keratosis: Secondary | ICD-10-CM | POA: Diagnosis not present

## 2019-10-26 DIAGNOSIS — L853 Xerosis cutis: Secondary | ICD-10-CM | POA: Diagnosis not present

## 2019-10-26 DIAGNOSIS — B354 Tinea corporis: Secondary | ICD-10-CM | POA: Diagnosis not present

## 2019-11-10 DIAGNOSIS — M533 Sacrococcygeal disorders, not elsewhere classified: Secondary | ICD-10-CM | POA: Diagnosis not present

## 2019-11-20 NOTE — Progress Notes (Signed)
Subjective:   Christopher Bradshaw is a 73 y.o. male who presents for Medicare Annual/Subsequent preventive examination.  This visit occurred during the SARS-CoV-2 public health emergency.  Safety protocols were in place, including screening questions prior to the visit, additional usage of staff PPE, and extensive cleaning of exam room while observing appropriate contact time as indicated for disinfecting solutions.  Review of Systems:   Cardiac Risk Factors include: advanced age (>58men, >2 women);diabetes mellitus;male gender;hypertension Sleep patterns: gets up 0-2 times nightly to void and sleep hours vary nightly. Patient reports insomnia issues, discussed recommended sleep tips.    Home Safety/Smoke Alarms: Feels safe in home. Smoke alarms in place.  Living environment; residence and Firearm Safety: 2-story house. Lives with wife, no needs for DME, good support system Seat Belt Safety/Bike Helmet: Wears seat belt.     Objective:    Vitals: BP 134/66   Pulse 69   Resp 17   Ht 6' (1.829 m)   Wt 170 lb (77.1 kg)   SpO2 99%   BMI 23.06 kg/m   Body mass index is 23.06 kg/m.  Advanced Directives 11/24/2019 11/12/2018 11/06/2017  Does Patient Have a Medical Advance Directive? No No No  Would patient like information on creating a medical advance directive? No - Patient declined No - Patient declined Yes (ED - Information included in AVS)    Tobacco Social History   Tobacco Use  Smoking Status Never Smoker  Smokeless Tobacco Never Used     Counseling given: Not Answered  Past Medical History:  Diagnosis Date  . Allergic rhinitis   . BPH (benign prostatic hypertrophy)    Dr Christopher Bradshaw  . Diabetes mellitus without complication (Christopher Bradshaw)    resolved with TLC  . Femoral bruit   . Gilbert syndrome    total bilirubin 1.5 in 2006  . Hyperglycemia   . Hyperlipidemia   . Hypertension   . Nephrolithiasis    calcium oxalate stone  . RBBB (right bundle branch block)   .  Thrombophlebitis 09/2004  . TIA (transient ischemic attack) 2015  . Varicose vein    right lower extremity   Past Surgical History:  Procedure Laterality Date  . cataract surgery Bilateral 03/05/2018  . COLONOSCOPY  2000, [id number]   negative X 3;Dr  Christopher Bradshaw  . CYSTOSCOPY     for hematuria,urethritis 2004, Dr Christopher Bradshaw  . lithrotripsy     for kidney stones  . NASAL SEPTUM SURGERY     deviation correction  . Renal calculi lithotripsy     x 1,residual L renal calculus, spontaneous passage 10-12 x  . TONSILLECTOMY     Family History  Problem Relation Age of Onset  . Lung cancer Father        smoker  . Alzheimer's disease Father   . Colon cancer Paternal Aunt   . Ovarian cancer Paternal Aunt   . Emphysema Paternal Grandfather   . Colon cancer Paternal Grandfather   . Diabetes Neg Hx   . Heart disease Neg Hx   . Stroke Neg Hx    Social History   Socioeconomic History  . Marital status: Married    Spouse name: Not on file  . Number of children: 2  . Years of education: Not on file  . Highest education level: Not on file  Occupational History  . Occupation: [employer], part time   Social Needs  . Financial resource strain: Not hard at all  . Food insecurity    Worry:  Never true    Inability: Never true  . Transportation needs    Medical: No    Non-medical: No  Tobacco Use  . Smoking status: Never Smoker  . Smokeless tobacco: Never Used  Substance and Sexual Activity  . Alcohol use: Yes    Comment:  very rarely  . Drug use: No  . Sexual activity: Not Currently  Lifestyle  . Physical activity    Days per week: 6 days    Minutes per session: 50 min  . Stress: Not at all  Relationships  . Social connections    Talks on phone: More than three times a week    Gets together: More than three times a week    Attends religious service: More than 4 times per year    Active member of club or organization: Yes    Attends meetings of clubs or organizations:  More than 4 times per year    Relationship status: Married  Other Topics Concern  . Not on file  Social History Narrative   Regular exercise- yes walks once daily 60 min w/o symtoms           Outpatient Encounter Medications as of 11/24/2019  Medication Sig  . acetaminophen (TYLENOL) 325 MG tablet Take 650 mg by mouth every 6 (six) hours as needed.  Marland Kitchen aspirin 81 MG tablet Take 81 mg by mouth 2 (two) times daily.   . benazepril (LOTENSIN) 40 MG tablet Take 1 tablet (40 mg total) by mouth daily.  Marland Kitchen glucose blood (FREESTYLE LITE) test strip USE TO TEST BLOOD SUGAR ONCE DAILY ICD E11 59  . ibuprofen (ADVIL) 200 MG tablet Take 200 mg by mouth every 6 (six) hours as needed.  . Lancets (FREESTYLE) lancets USE TO CHECK BLOOD SUGAR ONCE DAILY ICD E11 59  . metFORMIN (GLUCOPHAGE) 500 MG tablet TAKE 1 TABLET BY MOUTH TWICE DAILY WITH A MEAL  . Omega-3 Fatty Acids (FISH OIL) 1000 MG CAPS Take by mouth.    . simvastatin (ZOCOR) 40 MG tablet Take 1 tablet (40 mg total) by mouth at bedtime.  . vitamin E 100 UNIT capsule Take 400 Units by mouth daily.    No facility-administered encounter medications on file as of 11/24/2019.     Activities of Daily Living In your present state of health, do you have any difficulty performing the following activities: 11/24/2019  Hearing? N  Vision? N  Difficulty concentrating or making decisions? N  Walking or climbing stairs? N  Dressing or bathing? N  Doing errands, shopping? N  Preparing Food and eating ? N  Using the Toilet? N  In the past six months, have you accidently leaked urine? N  Do you have problems with loss of bowel control? N  Managing your Medications? N  Managing your Finances? N  Some recent data might be hidden    Patient Care Team: Hoyt Koch, MD as PCP - General (Internal Medicine) Irine Seal, MD as Attending Physician (Urology) Rutherford Guys, MD as Consulting Physician (Ophthalmology)   Assessment:   This is a routine  wellness examination for [name]. Physical assessment deferred to PCP.  Exercise Activities and Dietary recommendations Current Exercise Habits: Home exercise routine, Type of exercise: walking, Time (Minutes): 60, Frequency (Times/Week): 5, Weekly Exercise (Minutes/Week): 300, Intensity: Mild, Exercise limited by: orthopedic condition(s)  Diet (meal preparation, eat out, water intake, caffeinated beverages, dairy products, fruits and vegetables): in general, a "healthy" diet  , well balanced   Reviewed heart  healthy and diabetic diet. Encouraged patient to maintain daily water and healthy fluid intake.     Goals    . Patient Stated     Maintain current health status, stay as active and as independent as possible.    . Patient Stated     I want to maintain my health status, continue to walk and eat healthy. Enjoy life and family.       Fall Risk Fall Risk  11/24/2019 11/12/2018 11/03/2018 11/06/2017 07/15/2017  Falls in the past year? 1 0 0 No No  Comment - - Emmi Telephone Survey: data to providers prior to load - Emmi Telephone Survey: data to providers prior to load  Number falls in past yr: 0 - - - -  Injury with Fall? 1 - - - -  Follow up Falls prevention discussed - - - -   Is the patient's home free of loose throw rugs in walkways, pet beds, electrical cords, etc?   yes      Grab bars in the bathroom? yes      Handrails on the stairs?   yes      Adequate lighting?   yes  Depression Screen PHQ 2/9 Scores 11/24/2019 11/12/2018 11/06/2017 11/06/2017  PHQ - 2 Score 0 0 0 0  PHQ- 9 Score - - 0 -    Cognitive Function MMSE - Mini Mental State Exam 11/12/2018  Orientation to time 5  Orientation to Place 5  Registration 3  Attention/ Calculation 4  Recall 2  Language- name 2 objects 2  Language- repeat 1  Language- follow 3 step command 3  Language- read & follow direction 1  Write a sentence 1  Copy design 1  Total score 28       Ad8 score reviewed for issues:   Issues making decisions: no  Less interest in hobbies / activities: no  Repeats questions, stories (family complaining): no  Trouble using ordinary gadgets (microwave, computer, phone):no  Forgets the month or year: no  Mismanaging finances: no  Remembering appts: no  Daily problems with thinking and/or memory: no Ad8 score is= 0   Immunization History  Administered Date(s) Administered  . Fluad Quad(high Dose 65+) 10/12/2019  . Influenza Split 09/18/2012  . Influenza, High Dose Seasonal PF 09/29/2013, 08/31/2016, 10/07/2018  . Influenza,inj,Quad PF,6+ Mos 10/08/2014, 10/21/2015  . Influenza-Unspecified 09/25/2017  . Pneumococcal Conjugate-13 10/20/2015  . Pneumococcal Polysaccharide-23 07/20/2011  . Td 01/25/2009  . Tdap 06/03/2019   Screening Tests Health Maintenance  Topic Date Due  . Hepatitis C Screening  12/05/1946  . HEMOGLOBIN A1C  12/03/2019  . OPHTHALMOLOGY EXAM  07/22/2020  . FOOT EXAM  11/23/2020  . COLONOSCOPY  10/29/2023  . TETANUS/TDAP  06/02/2029  . INFLUENZA VACCINE  Completed  . PNA vac Low Risk Adult  Completed       Plan:    Reviewed health maintenance screenings with patient today and relevant education, vaccines, and/or referrals were provided.   I have personally reviewed and noted the following in the patient's chart:   . Medical and social history . Use of alcohol, tobacco or illicit drugs  . Current medications and supplements . Functional ability and status . Nutritional status . Physical activity . Advanced directives . List of other physicians . Vitals . Screenings to include cognitive, depression, and falls . Referrals and appointments  In addition, I have reviewed and discussed with patient certain preventive protocols, quality metrics, and best practice recommendations. A written personalized care  plan for preventive services as well as general preventive health recommendations were provided to patient.     Michiel Cowboy,  RN  11/24/2019

## 2019-11-24 ENCOUNTER — Ambulatory Visit (INDEPENDENT_AMBULATORY_CARE_PROVIDER_SITE_OTHER): Payer: Medicare Other | Admitting: *Deleted

## 2019-11-24 ENCOUNTER — Other Ambulatory Visit: Payer: Self-pay

## 2019-11-24 VITALS — BP 134/66 | HR 69 | Resp 17 | Ht 72.0 in | Wt 170.0 lb

## 2019-11-24 DIAGNOSIS — E1151 Type 2 diabetes mellitus with diabetic peripheral angiopathy without gangrene: Secondary | ICD-10-CM | POA: Diagnosis not present

## 2019-11-24 DIAGNOSIS — Z0000 Encounter for general adult medical examination without abnormal findings: Secondary | ICD-10-CM | POA: Diagnosis not present

## 2019-11-24 NOTE — Patient Instructions (Addendum)
Continue doing brain stimulating activities (puzzles, reading, adult coloring books, staying active) to keep memory sharp.   Continue to eat heart healthy diet (full of fruits, vegetables, whole grains, lean protein, water--limit salt, fat, and sugar intake) and increase physical activity as tolerated.   Christopher Bradshaw , Thank you for taking time to come for your Medicare Wellness Visit. I appreciate your ongoing commitment to your health goals. Please review the following plan we discussed and let me know if I can assist you in the future.   These are the goals we discussed: Goals    . Patient Stated     Maintain current health status, stay as active and as independent as possible.    . Patient Stated     I want to maintain my health status, continue to walk and eat healthy. Enjoy life and family.       This is a list of the screening recommended for you and due dates:  Health Maintenance  Topic Date Due  .  Hepatitis C: One time screening is recommended by Center for Disease Control  (CDC) for  adults born from [birth year] through 1967.   [birth year]  . Flu Shot  07/18/2019  . Eye exam for diabetics  07/22/2019  . Complete foot exam   11/13/2019  . Hemoglobin A1C  12/03/2019  . Colon Cancer Screening  10/29/2023  . Tetanus Vaccine  06/02/2029  . Pneumonia vaccines  Completed    Preventive Care 75 Years and Older, Male Preventive care refers to lifestyle choices and visits with your health care provider that can promote health and wellness. This includes:  A yearly physical exam. This is also called an annual well check.  Regular dental and eye exams.  Immunizations.  Screening for certain conditions.  Healthy lifestyle choices, such as diet and exercise. What can I expect for my preventive care visit? Physical exam Your health care provider will check:  Height and weight. These may be used to calculate body mass index (BMI), which is a measurement that tells if you are at a  healthy weight.  Heart rate and blood pressure.  Your skin for abnormal spots. Counseling Your health care provider may ask you questions about:  Alcohol, tobacco, and drug use.  Emotional well-being.  Home and relationship well-being.  Sexual activity.  Eating habits.  History of falls.  Memory and ability to understand (cognition).  Work and work Statistician. What immunizations do I need?  Influenza (flu) vaccine  This is recommended every year. Tetanus, diphtheria, and pertussis (Tdap) vaccine  You may need a Td booster every 10 years. Varicella (chickenpox) vaccine  You may need this vaccine if you have not already been vaccinated. Zoster (shingles) vaccine  You may need this after age 45. Pneumococcal conjugate (PCV13) vaccine  One dose is recommended after age 42. Pneumococcal polysaccharide (PPSV23) vaccine  One dose is recommended after age 33. Measles, mumps, and rubella (MMR) vaccine  You may need at least one dose of MMR if you were born in 1957 or later. You may also need a second dose. Meningococcal conjugate (MenACWY) vaccine  You may need this if you have certain conditions. Hepatitis A vaccine  You may need this if you have certain conditions or if you travel or work in places where you may be exposed to hepatitis A. Hepatitis B vaccine  You may need this if you have certain conditions or if you travel or work in places where you may be exposed to  hepatitis B. Haemophilus influenzae type b (Hib) vaccine  You may need this if you have certain conditions. You may receive vaccines as individual doses or as more than one vaccine together in one shot (combination vaccines). Talk with your health care provider about the risks and benefits of combination vaccines. What tests do I need? Blood tests  Lipid and cholesterol levels. These may be checked every 5 years, or more frequently depending on your overall health.  Hepatitis C  test.  Hepatitis B test. Screening  Lung cancer screening. You may have this screening every year starting at age 54 if you have a 30-pack-year history of smoking and currently smoke or have quit within the past 15 years.  Colorectal cancer screening. All adults should have this screening starting at age 75 and continuing until age 82. Your health care provider may recommend screening at age 64 if you are at increased risk. You will have tests every 1-10 years, depending on your results and the type of screening test.  Prostate cancer screening. Recommendations will vary depending on your family history and other risks.  Diabetes screening. This is done by checking your blood sugar (glucose) after you have not eaten for a while (fasting). You may have this done every 1-3 years.  Abdominal aortic aneurysm (AAA) screening. You may need this if you are a current or former smoker.  Sexually transmitted disease (STD) testing. Follow these instructions at home: Eating and drinking  Eat a diet that includes fresh fruits and vegetables, whole grains, lean protein, and low-fat dairy products. Limit your intake of foods with high amounts of sugar, saturated fats, and salt.  Take vitamin and mineral supplements as recommended by your health care provider.  Do not drink alcohol if your health care provider tells you not to drink.  If you drink alcohol: ? Limit how much you have to 0-2 drinks a day. ? Be aware of how much alcohol is in your drink. In the [place] one drink equals one 12 oz bottle of beer (355 mL), one 5 oz glass of Bralin Garry (148 mL), or one 1 oz glass of hard liquor (44 mL). Lifestyle  Take daily care of your teeth and gums.  Stay active. Exercise for at least 30 minutes on 5 or more days each week.  Do not use any products that contain nicotine or tobacco, such as cigarettes, e-cigarettes, and chewing tobacco. If you need help quitting, ask your health care provider.  If you are  sexually active, practice safe sex. Use a condom or other form of protection to prevent STIs (sexually transmitted infections).  Talk with your health care provider about taking a low-dose aspirin or statin. What's next?  Visit your health care provider once a year for a well check visit.  Ask your health care provider how often you should have your eyes and teeth checked.  Stay up to date on all vaccines. This information is not intended to replace advice given to you by your health care provider. Make sure you discuss any questions you have with your health care provider. Document Released: 12/30/2015 Document Revised: 11/27/2018 Document Reviewed: 11/27/2018 Elsevier Patient Education  2020 [employer].

## 2019-11-24 NOTE — Progress Notes (Signed)
Medical screening examination/treatment/procedure(s) were performed by non-physician practitioner and as supervising physician I was immediately available for consultation/collaboration. I agree with above. Elizabeth A Crawford, MD 

## 2019-12-04 ENCOUNTER — Encounter: Payer: Self-pay | Admitting: Internal Medicine

## 2019-12-04 ENCOUNTER — Other Ambulatory Visit: Payer: Self-pay

## 2019-12-04 ENCOUNTER — Other Ambulatory Visit (INDEPENDENT_AMBULATORY_CARE_PROVIDER_SITE_OTHER): Payer: Medicare Other

## 2019-12-04 ENCOUNTER — Ambulatory Visit (INDEPENDENT_AMBULATORY_CARE_PROVIDER_SITE_OTHER): Payer: Medicare Other | Admitting: Internal Medicine

## 2019-12-04 VITALS — BP 130/82 | HR 79 | Temp 97.7°F | Ht 72.0 in | Wt 167.0 lb

## 2019-12-04 DIAGNOSIS — N400 Enlarged prostate without lower urinary tract symptoms: Secondary | ICD-10-CM

## 2019-12-04 DIAGNOSIS — I10 Essential (primary) hypertension: Secondary | ICD-10-CM

## 2019-12-04 DIAGNOSIS — E1169 Type 2 diabetes mellitus with other specified complication: Secondary | ICD-10-CM | POA: Diagnosis not present

## 2019-12-04 DIAGNOSIS — E1151 Type 2 diabetes mellitus with diabetic peripheral angiopathy without gangrene: Secondary | ICD-10-CM

## 2019-12-04 DIAGNOSIS — E785 Hyperlipidemia, unspecified: Secondary | ICD-10-CM

## 2019-12-04 LAB — CBC
HCT: 44.5 % (ref 39.0–52.0)
Hemoglobin: 15.1 g/dL (ref 13.0–17.0)
MCHC: 33.9 g/dL (ref 30.0–36.0)
MCV: 91.2 fl (ref 78.0–100.0)
Platelets: 117 10*3/uL — ABNORMAL LOW (ref 150.0–400.0)
RBC: 4.88 Mil/uL (ref 4.22–5.81)
RDW: 13.1 % (ref 11.5–15.5)
WBC: 8.3 10*3/uL (ref 4.0–10.5)

## 2019-12-04 LAB — COMPREHENSIVE METABOLIC PANEL
ALT: 12 U/L (ref 0–53)
AST: 14 U/L (ref 0–37)
Albumin: 4.6 g/dL (ref 3.5–5.2)
Alkaline Phosphatase: 129 U/L — ABNORMAL HIGH (ref 39–117)
BUN: 19 mg/dL (ref 6–23)
CO2: 30 mEq/L (ref 19–32)
Calcium: 10 mg/dL (ref 8.4–10.5)
Chloride: 104 mEq/L (ref 96–112)
Creatinine, Ser: 0.8 mg/dL (ref 0.40–1.50)
GFR: 94.04 mL/min (ref >60.00)
Glucose, Bld: 130 mg/dL — ABNORMAL HIGH (ref 70–99)
Potassium: 4.9 mEq/L (ref 3.5–5.1)
Sodium: 140 mEq/L (ref 135–145)
Total Bilirubin: 0.7 mg/dL (ref 0.2–1.2)
Total Protein: 7.1 g/dL (ref 6.0–8.3)

## 2019-12-04 LAB — LIPID PANEL
Cholesterol: 163 mg/dL (ref 0–200)
HDL: 52.1 mg/dL (ref >39.00)
LDL Cholesterol: 92 mg/dL (ref 0–99)
NonHDL: 110.87
Total CHOL/HDL Ratio: 3
Triglycerides: 92 mg/dL (ref 0.0–149.0)
VLDL: 18.4 mg/dL (ref 0.0–40.0)

## 2019-12-04 LAB — HEMOGLOBIN A1C: Hgb A1c MFr Bld: 7.1 % — ABNORMAL HIGH (ref 4.6–6.5)

## 2019-12-04 MED ORDER — SIMVASTATIN 40 MG PO TABS: 40.0000 mg | ORAL_TABLET | Freq: Every day | ORAL | 3 refills | Status: DC

## 2019-12-04 MED ORDER — BENAZEPRIL HCL 40 MG PO TABS: 40.0000 mg | ORAL_TABLET | Freq: Every day | ORAL | 3 refills | Status: DC

## 2019-12-04 MED ORDER — METFORMIN HCL 500 MG PO TABS: ORAL_TABLET | ORAL | 3 refills | Status: DC

## 2019-12-04 NOTE — Assessment & Plan Note (Signed)
Checking HgA1c, lipid panel. On ACE-I and statin. Taking metformin with previous good control. Adjust as needed.

## 2019-12-04 NOTE — Patient Instructions (Signed)

## 2019-12-04 NOTE — Assessment & Plan Note (Signed)
BP at goal on benazepril 40 mg daily. Checking CMP and adjust as needed.

## 2019-12-04 NOTE — Progress Notes (Signed)
   Subjective:   Patient ID: Christopher Bradshaw, male    DOB: Apr 07, 1946, 73 y.o.   MRN: [id number]  HPI The patient is a 73 YO man coming in for follow up diabetes (taking metformin, on ACE-I and statin, denies worsening sugars, had tailbone fracture and is not exercising as usual the last month, denies numbness or tingling in feet) and blood pressure (taking benazepril, denies side effects, denies chest pains or headaches) and cholesterol (taking simvastatin 40 mg daily, denies side effects, denies chest pains or stroke symptoms).   Review of Systems  Constitutional: Negative.   HENT: Negative.   Eyes: Negative.   Respiratory: Negative for cough, chest tightness and shortness of breath.   Cardiovascular: Negative for chest pain, palpitations and leg swelling.  Gastrointestinal: Negative for abdominal distention, abdominal pain, constipation, diarrhea, nausea and vomiting.  Musculoskeletal: Negative.   Skin: Negative.   Neurological: Negative.   Psychiatric/Behavioral: Negative.     Objective:  Physical Exam Constitutional:      Appearance: He is well-developed.  HENT:     Head: Normocephalic and atraumatic.  Cardiovascular:     Rate and Rhythm: Normal rate and regular rhythm.  Pulmonary:     Effort: Pulmonary effort is normal. No respiratory distress.     Breath sounds: Normal breath sounds. No wheezing or rales.  Abdominal:     General: Bowel sounds are normal. There is no distension.     Palpations: Abdomen is soft.     Tenderness: There is no abdominal tenderness. There is no rebound.  Musculoskeletal:     Cervical back: Normal range of motion.  Skin:    General: Skin is warm and dry.  Neurological:     Mental Status: He is alert and oriented to person, place, and time.     Coordination: Coordination normal.     Vitals:   12/04/19 0850  BP: 130/82  Pulse: 79  Temp: 97.7 F (36.5 C)  TempSrc: Oral  SpO2: 98%  Weight: 167 lb (75.8 kg)  Height: 6' (1.829 m)    This  visit occurred during the SARS-CoV-2 public health emergency.  Safety protocols were in place, including screening questions prior to the visit, additional usage of staff PPE, and extensive cleaning of exam room while observing appropriate contact time as indicated for disinfecting solutions.   Assessment & Plan:

## 2019-12-04 NOTE — Assessment & Plan Note (Signed)
Checking lipid panel, adjust simvastatin 40 mg daily as needed.

## 2019-12-04 NOTE — Assessment & Plan Note (Signed)
Sees urology yearly and stable.

## 2020-02-09 ENCOUNTER — Ambulatory Visit (INDEPENDENT_AMBULATORY_CARE_PROVIDER_SITE_OTHER): Payer: Medicare Other | Admitting: Internal Medicine

## 2020-02-09 ENCOUNTER — Encounter: Payer: Self-pay | Admitting: Internal Medicine

## 2020-02-09 ENCOUNTER — Other Ambulatory Visit: Payer: Self-pay

## 2020-02-09 DIAGNOSIS — E1151 Type 2 diabetes mellitus with diabetic peripheral angiopathy without gangrene: Secondary | ICD-10-CM

## 2020-02-09 NOTE — Patient Instructions (Signed)
You can use neosporin on the toes if needed.

## 2020-02-09 NOTE — Progress Notes (Signed)
   Subjective:   Patient ID: Christopher Bradshaw, male    DOB: 1946/12/16, 74 y.o.   MRN: [id number]  HPI The patient is a 74 YO man coming in for wound to left foot on the big toe and 2nd toe. The big toe he did cut accidentally when trimming the nail. This was a little red and swollen and tender when he called for appointment. Since has started to improve some but having diabetes he wanted to ensure it was not infected. 2nd left toe he dropped something on weeks to month ago and the nail turned dark. Denies current pain or swelling in that toe. No fevers or chills.    Review of Systems  Constitutional: Negative.   HENT: Negative.   Eyes: Negative.   Respiratory: Negative for cough, chest tightness and shortness of breath.   Cardiovascular: Negative for chest pain, palpitations and leg swelling.  Gastrointestinal: Negative for abdominal distention, abdominal pain, constipation, diarrhea, nausea and vomiting.  Musculoskeletal: Negative.   Skin: Negative.   Neurological: Negative.   Psychiatric/Behavioral: Negative.     Objective:  Physical Exam Constitutional:      Appearance: He is well-developed.  HENT:     Head: Normocephalic and atraumatic.  Cardiovascular:     Rate and Rhythm: Normal rate and regular rhythm.  Pulmonary:     Effort: Pulmonary effort is normal. No respiratory distress.     Breath sounds: Normal breath sounds. No wheezing or rales.  Abdominal:     General: Bowel sounds are normal. There is no distension.     Palpations: Abdomen is soft.     Tenderness: There is no abdominal tenderness. There is no rebound.  Musculoskeletal:     Cervical back: Normal range of motion.     Comments: Left 1st toe with some residual darkening of the skin in area of healed wound, no signs of infection and mild tenderness with deep palpation, left 2nd toe with dark nail consistent with bruising under the nail, no signs of infection and no tenderness on exam  Skin:    General: Skin is warm  and dry.  Neurological:     Mental Status: He is alert and oriented to person, place, and time.     Coordination: Coordination normal.     Vitals:   02/09/20 1257  BP: 126/84  Pulse: 85  Temp: 98.8 F (37.1 C)  TempSrc: Oral  SpO2: 96%  Weight: 167 lb (75.8 kg)  Height: 6' (1.829 m)    This visit occurred during the SARS-CoV-2 public health emergency.  Safety protocols were in place, including screening questions prior to the visit, additional usage of staff PPE, and extensive cleaning of exam room while observing appropriate contact time as indicated for disinfecting solutions.   Assessment & Plan:

## 2020-02-10 NOTE — Assessment & Plan Note (Signed)
Given diabetes we talked about how the toes are likely to take more time to heal overall. Do not appear infected. Advised to monitor closely and call or come back with any changes.

## 2020-03-24 ENCOUNTER — Telehealth: Payer: Self-pay | Admitting: Internal Medicine

## 2020-03-24 NOTE — Telephone Encounter (Signed)
    Patient calling to request Kenalog shot. Past states he has gotten the shot in the office in past, stating his allergies have recently started to  flare up. I have tentatively scheduled him for 4/14 for an office visit to discuss. Is the Kenalog shot available in the office?  Please advise

## 2020-03-28 NOTE — Telephone Encounter (Signed)
Yes, if needed.

## 2020-03-30 ENCOUNTER — Encounter: Payer: Self-pay | Admitting: Internal Medicine

## 2020-03-30 ENCOUNTER — Ambulatory Visit (INDEPENDENT_AMBULATORY_CARE_PROVIDER_SITE_OTHER): Payer: Medicare Other | Admitting: Internal Medicine

## 2020-03-30 ENCOUNTER — Other Ambulatory Visit: Payer: Self-pay

## 2020-03-30 VITALS — BP 162/86 | HR 70 | Temp 98.0°F | Ht 72.0 in | Wt 169.4 lb

## 2020-03-30 DIAGNOSIS — J301 Allergic rhinitis due to pollen: Secondary | ICD-10-CM | POA: Diagnosis not present

## 2020-03-30 MED ORDER — METHYLPREDNISOLONE ACETATE 40 MG/ML IJ SUSP
40.0000 mg | Freq: Once | INTRAMUSCULAR | Status: AC
  Administered 2020-03-30: 40 mg via INTRAMUSCULAR

## 2020-03-30 NOTE — Progress Notes (Signed)
   Subjective:   Patient ID: Christopher Bradshaw, male    DOB: August 11, 1946, 74 y.o.   MRN: [id number]  HPI The patient is a 74 YO man coming in for seasonal allergies. Often times he gets severe allergies in the spring. Usually needs depo-medrol shot to help clear. He is taking otc medications for allergies without relief. Uses mask when outside to avoid pollen. Non-smoker. Covid-19 vaccination complete. Denies fevers or chills. Denies SOB. Mild cough and congestion present. No sinus pain or pressure.   Review of Systems  Constitutional: Negative for activity change, appetite change, chills, fatigue, fever and unexpected weight change.  HENT: Positive for congestion, postnasal drip and rhinorrhea. Negative for ear discharge, ear pain, sinus pressure, sinus pain, sneezing, sore throat, tinnitus, trouble swallowing and voice change.   Eyes: Negative.   Respiratory: Positive for cough. Negative for chest tightness, shortness of breath and wheezing.   Cardiovascular: Negative.   Gastrointestinal: Negative.   Musculoskeletal: Negative.   Neurological: Negative.     Objective:  Physical Exam Constitutional:      Appearance: He is well-developed.  HENT:     Head: Normocephalic and atraumatic.     Nose: Congestion and rhinorrhea present.     Mouth/Throat:     Pharynx: No oropharyngeal exudate or posterior oropharyngeal erythema.  Eyes:     Comments: Eyes tearing during visit  Cardiovascular:     Rate and Rhythm: Normal rate and regular rhythm.  Pulmonary:     Effort: Pulmonary effort is normal. No respiratory distress.     Breath sounds: Normal breath sounds. No wheezing or rales.  Abdominal:     General: Bowel sounds are normal. There is no distension.     Palpations: Abdomen is soft.     Tenderness: There is no abdominal tenderness. There is no rebound.  Musculoskeletal:     Cervical back: Normal range of motion.  Skin:    General: Skin is warm and dry.  Neurological:     Mental Status:  He is alert and oriented to person, place, and time.     Coordination: Coordination normal.     Vitals:   03/30/20 1015  BP: (!) 162/86  Pulse: 70  Temp: 98 F (36.7 C)  SpO2: 99%  Weight: 169 lb 6.4 oz (76.8 kg)  Height: 6' (1.829 m)    This visit occurred during the SARS-CoV-2 public health emergency.  Safety protocols were in place, including screening questions prior to the visit, additional usage of staff PPE, and extensive cleaning of exam room while observing appropriate contact time as indicated for disinfecting solutions.   Assessment & Plan:  Depo-medrol 40 mg IM given at visit

## 2020-03-30 NOTE — Patient Instructions (Signed)
We have given you the shot today to help with the allergies.

## 2020-03-31 NOTE — Assessment & Plan Note (Signed)
Given depo-medrol 40 mg IM at visit. Continue nasacort and allegra for symptoms.

## 2020-08-02 DIAGNOSIS — E119 Type 2 diabetes mellitus without complications: Secondary | ICD-10-CM | POA: Diagnosis not present

## 2020-08-02 DIAGNOSIS — Z7984 Long term (current) use of oral hypoglycemic drugs: Secondary | ICD-10-CM | POA: Diagnosis not present

## 2020-08-02 DIAGNOSIS — Z961 Presence of intraocular lens: Secondary | ICD-10-CM | POA: Diagnosis not present

## 2020-09-19 DIAGNOSIS — Z23 Encounter for immunization: Secondary | ICD-10-CM | POA: Diagnosis not present

## 2020-10-03 DIAGNOSIS — Z23 Encounter for immunization: Secondary | ICD-10-CM | POA: Diagnosis not present

## 2020-11-24 ENCOUNTER — Other Ambulatory Visit: Payer: Self-pay

## 2020-11-24 ENCOUNTER — Ambulatory Visit (INDEPENDENT_AMBULATORY_CARE_PROVIDER_SITE_OTHER): Payer: Medicare Other

## 2020-11-24 VITALS — BP 160/80 | HR 66 | Temp 98.3°F | Ht 72.0 in | Wt 163.4 lb

## 2020-11-24 DIAGNOSIS — Z0000 Encounter for general adult medical examination without abnormal findings: Secondary | ICD-10-CM

## 2020-11-24 NOTE — Patient Instructions (Signed)
Christopher Bradshaw , Thank you for taking time to come for your Medicare Wellness Visit. I appreciate your ongoing commitment to your health goals. Please review the following plan we discussed and let me know if I can assist you in the future.   Screening recommendations/referrals: Colonoscopy: no repeat due to age; unless there is an issue  Recommended yearly ophthalmology/optometry visit for glaucoma screening and checkup Recommended yearly dental visit for hygiene and checkup  Vaccinations: Influenza vaccine: 10/12/2019 Pneumococcal vaccine: up to date Tdap vaccine: 06/03/2019; due every 10 years Shingles vaccine: never done  Covid-19: up to date  Advanced directives: Advance directive discussed with you today. Even though you declined this today please call our office should you change your mind and we can give you the proper paperwork for you to fill out.  Conditions/risks identified: Yes; Reviewed health maintenance screenings with patient today and relevant education, vaccines, and/or referrals were provided. Please continue to do your personal lifestyle choices by: daily care of teeth and gums, regular physical activity (goal should be 5 days a week for 30 minutes), eat a healthy diet, avoid tobacco and drug use, limiting any alcohol intake, taking a low-dose aspirin (if not allergic or have been advised by your provider otherwise) and taking vitamins and minerals as recommended by your provider. Continue doing brain stimulating activities (puzzles, reading, adult coloring books, staying active) to keep memory sharp. Continue to eat heart healthy diet (full of fruits, vegetables, whole grains, lean protein, water--limit salt, fat, and sugar intake) and increase physical activity as tolerated.  Next appointment: Please schedule your next Medicare Wellness Visit with your Nurse Health Advisor in 1 year by calling [phone].  Preventive Care 76 Years and Older, Male Preventive care refers to  lifestyle choices and visits with your health care provider that can promote health and wellness. What does preventive care include?  A yearly physical exam. This is also called an annual well check.  Dental exams once or twice a year.  Routine eye exams. Ask your health care provider how often you should have your eyes checked.  Personal lifestyle choices, including:  Daily care of your teeth and gums.  Regular physical activity.  Eating a healthy diet.  Avoiding tobacco and drug use.  Limiting alcohol use.  Practicing safe sex.  Taking low doses of aspirin every day.  Taking vitamin and mineral supplements as recommended by your health care provider. What happens during an annual well check? The services and screenings done by your health care provider during your annual well check will depend on your age, overall health, lifestyle risk factors, and family history of disease. Counseling  Your health care provider may ask you questions about your:  Alcohol use.  Tobacco use.  Drug use.  Emotional well-being.  Home and relationship well-being.  Sexual activity.  Eating habits.  History of falls.  Memory and ability to understand (cognition).  Work and work Statistician. Screening  You may have the following tests or measurements:  Height, weight, and BMI.  Blood pressure.  Lipid and cholesterol levels. These may be checked every 5 years, or more frequently if you are over 76 years old.  Skin check.  Lung cancer screening. You may have this screening every year starting at age 76 if you have a 30-pack-year history of smoking and currently smoke or have quit within the past 15 years.  Fecal occult blood test (FOBT) of the stool. You may have this test every year starting at age 76.  Flexible sigmoidoscopy or colonoscopy. You may have a sigmoidoscopy every 5 years or a colonoscopy every 10 years starting at age 76.  Prostate cancer screening.  Recommendations will vary depending on your family history and other risks.  Hepatitis C blood test.  Hepatitis B blood test.  Sexually transmitted disease (STD) testing.  Diabetes screening. This is done by checking your blood sugar (glucose) after you have not eaten for a while (fasting). You may have this done every 1-3 years.  Abdominal aortic aneurysm (AAA) screening. You may need this if you are a current or former smoker.  Osteoporosis. You may be screened starting at age 76 if you are at high risk. Talk with your health care provider about your test results, treatment options, and if necessary, the need for more tests. Vaccines  Your health care provider may recommend certain vaccines, such as:  Influenza vaccine. This is recommended every year.  Tetanus, diphtheria, and acellular pertussis (Tdap, Td) vaccine. You may need a Td booster every 10 years.  Zoster vaccine. You may need this after age 76.  Pneumococcal 13-valent conjugate (PCV13) vaccine. One dose is recommended after age 76.  Pneumococcal polysaccharide (PPSV23) vaccine. One dose is recommended after age 76. Talk to your health care provider about which screenings and vaccines you need and how often you need them. This information is not intended to replace advice given to you by your health care provider. Make sure you discuss any questions you have with your health care provider. Document Released: 12/30/2015 Document Revised: 08/22/2016 Document Reviewed: 10/04/2015 Elsevier Interactive Patient Education  2017 [employer] Prevention in the Home Falls can cause injuries. They can happen to people of all ages. There are many things you can do to make your home safe and to help prevent falls. What can I do on the outside of my home?  Regularly fix the edges of walkways and driveways and fix any cracks.  Remove anything that might make you trip as you walk through a door, such as a raised step or  threshold.  Trim any bushes or trees on the path to your home.  Use bright outdoor lighting.  Clear any walking paths of anything that might make someone trip, such as rocks or tools.  Regularly check to see if handrails are loose or broken. Make sure that both sides of any steps have handrails.  Any raised decks and porches should have guardrails on the edges.  Have any leaves, snow, or ice cleared regularly.  Use sand or salt on walking paths during winter.  Clean up any spills in your garage right away. This includes oil or grease spills. What can I do in the bathroom?  Use night lights.  Install grab bars by the toilet and in the tub and shower. Do not use towel bars as grab bars.  Use non-skid mats or decals in the tub or shower.  If you need to sit down in the shower, use a plastic, non-slip stool.  Keep the floor dry. Clean up any water that spills on the floor as soon as it happens.  Remove soap buildup in the tub or shower regularly.  Attach bath mats securely with double-sided non-slip rug tape.  Do not have throw rugs and other things on the floor that can make you trip. What can I do in the bedroom?  Use night lights.  Make sure that you have a light by your bed that is easy to reach.  Do  not use any sheets or blankets that are too big for your bed. They should not hang down onto the floor.  Have a firm chair that has side arms. You can use this for support while you get dressed.  Do not have throw rugs and other things on the floor that can make you trip. What can I do in the kitchen?  Clean up any spills right away.  Avoid walking on wet floors.  Keep items that you use a lot in easy-to-reach places.  If you need to reach something above you, use a strong step stool that has a grab bar.  Keep electrical cords out of the way.  Do not use floor polish or wax that makes floors slippery. If you must use wax, use non-skid floor wax.  Do not have  throw rugs and other things on the floor that can make you trip. What can I do with my stairs?  Do not leave any items on the stairs.  Make sure that there are handrails on both sides of the stairs and use them. Fix handrails that are broken or loose. Make sure that handrails are as long as the stairways.  Check any carpeting to make sure that it is firmly attached to the stairs. Fix any carpet that is loose or worn.  Avoid having throw rugs at the top or bottom of the stairs. If you do have throw rugs, attach them to the floor with carpet tape.  Make sure that you have a light switch at the top of the stairs and the bottom of the stairs. If you do not have them, ask someone to add them for you. What else can I do to help prevent falls?  Wear shoes that:  Do not have high heels.  Have rubber bottoms.  Are comfortable and fit you well.  Are closed at the toe. Do not wear sandals.  If you use a stepladder:  Make sure that it is fully opened. Do not climb a closed stepladder.  Make sure that both sides of the stepladder are locked into place.  Ask someone to hold it for you, if possible.  Clearly mark and make sure that you can see:  Any grab bars or handrails.  First and last steps.  Where the edge of each step is.  Use tools that help you move around (mobility aids) if they are needed. These include:  Canes.  Walkers.  Scooters.  Crutches.  Turn on the lights when you go into a dark area. Replace any light bulbs as soon as they burn out.  Set up your furniture so you have a clear path. Avoid moving your furniture around.  If any of your floors are uneven, fix them.  If there are any pets around you, be aware of where they are.  Review your medicines with your doctor. Some medicines can make you feel dizzy. This can increase your chance of falling. Ask your doctor what other things that you can do to help prevent falls. This information is not intended to  replace advice given to you by your health care provider. Make sure you discuss any questions you have with your health care provider. Document Released: 09/29/2009 Document Revised: 05/10/2016 Document Reviewed: 01/07/2015 Elsevier Interactive Patient Education  2017 [employer].

## 2020-11-24 NOTE — Progress Notes (Signed)
Subjective:   Christopher Bradshaw is a 74 y.o. male who presents for Medicare Annual/Subsequent preventive examination.  Review of Systems    No ROS. Medicare Wellness Visit. Additional risk factors are reflected in social history. Cardiac Risk Factors include: advanced age (>68men, >50 women);diabetes mellitus;dyslipidemia;family history of premature cardiovascular disease;hypertension;male gender     Objective:    Today's Vitals   11/24/20 1058  BP: (!) 160/80  Pulse: 66  Temp: 98.3 F (36.8 C)  SpO2: 99%  Weight: 163 lb 6.4 oz (74.1 kg)  Height: 6' (1.829 m)  PainSc: 0-No pain   Body mass index is 22.16 kg/m.  Advanced Directives 11/24/2020 11/24/2019 11/12/2018 11/06/2017  Does Patient Have a Medical Advance Directive? No No No No  Would patient like information on creating a medical advance directive? No - Patient declined No - Patient declined No - Patient declined Yes (ED - Information included in AVS)    Current Medications (verified) Outpatient Encounter Medications as of 11/24/2020  Medication Sig  . acetaminophen (TYLENOL) 325 MG tablet Take 650 mg by mouth every 6 (six) hours as needed.  Marland Kitchen aspirin 81 MG tablet Take 81 mg by mouth 2 (two) times daily.  . benazepril (LOTENSIN) 40 MG tablet Take 1 tablet (40 mg total) by mouth daily.  Marland Kitchen glucose blood (FREESTYLE LITE) test strip USE TO TEST BLOOD SUGAR ONCE DAILY ICD E11 59  . ibuprofen (ADVIL) 200 MG tablet Take 200 mg by mouth every 6 (six) hours as needed.  . Lancets (FREESTYLE) lancets USE TO CHECK BLOOD SUGAR ONCE DAILY ICD E11 59  . metFORMIN (GLUCOPHAGE) 500 MG tablet TAKE 1 TABLET BY MOUTH TWICE DAILY WITH A MEAL  . Omega-3 Fatty Acids (FISH OIL) 1000 MG CAPS Take by mouth.  . simvastatin (ZOCOR) 40 MG tablet Take 1 tablet (40 mg total) by mouth at bedtime.  . vitamin E 100 UNIT capsule Take 400 Units by mouth daily.   No facility-administered encounter medications on file as of 11/24/2020.    Allergies  (verified) Patient has no known allergies.   History: Past Medical History:  Diagnosis Date  . Allergic rhinitis   . BPH (benign prostatic hypertrophy)    Dr Jeffie Pollock  . Diabetes mellitus without complication ([place])    resolved with TLC  . Femoral bruit   . Gilbert syndrome    total bilirubin 1.5 in 2006  . Hyperglycemia   . Hyperlipidemia   . Hypertension   . Nephrolithiasis    calcium oxalate stone  . RBBB (right bundle branch block)   . Thrombophlebitis 09/2004  . TIA (transient ischemic attack) 2015  . Varicose vein    right lower extremity   Past Surgical History:  Procedure Laterality Date  . cataract surgery Bilateral 03/05/2018  . COLONOSCOPY  2000, 3846,6599   negative X 3;Dr  Delfin Edis  . CYSTOSCOPY     for hematuria,urethritis 2004, Dr Serita Butcher  . lithrotripsy     for kidney stones  . NASAL SEPTUM SURGERY     deviation correction  . Renal calculi lithotripsy     x 1,residual L renal calculus, spontaneous passage 10-12 x  . TONSILLECTOMY     Family History  Problem Relation Age of Onset  . Lung cancer Father        smoker  . Alzheimer's disease Father   . Colon cancer Paternal Aunt   . Ovarian cancer Paternal Aunt   . Emphysema Paternal Grandfather   . Colon cancer  Paternal Grandfather   . Diabetes Neg Hx   . Heart disease Neg Hx   . Stroke Neg Hx    Social History   Socioeconomic History  . Marital status: Married    Spouse name: Not on file  . Number of children: 2  . Years of education: Not on file  . Highest education level: Not on file  Occupational History  . Occupation: [employer], part time   Tobacco Use  . Smoking status: Never Smoker  . Smokeless tobacco: Never Used  Vaping Use  . Vaping Use: Never used  Substance and Sexual Activity  . Alcohol use: Yes    Comment:  very rarely  . Drug use: No  . Sexual activity: Not Currently  Other Topics Concern  . Not on file  Social History Narrative   Regular exercise- yes  walks once daily 60 min w/o symtoms          Social Determinants of Health   Financial Resource Strain: Low Risk   . Difficulty of Paying Living Expenses: Not hard at all  Food Insecurity: No Food Insecurity  . Worried About Charity fundraiser in the Last Year: Never true  . Ran Out of Food in the Last Year: Never true  Transportation Needs: No Transportation Needs  . Lack of Transportation (Medical): No  . Lack of Transportation (Non-Medical): No  Physical Activity: Sufficiently Active  . Days of Exercise per Week: 7 days  . Minutes of Exercise per Session: 60 min  Stress: No Stress Concern Present  . Feeling of Stress : Not at all  Social Connections: Socially Integrated  . Frequency of Communication with Friends and Family: More than three times a week  . Frequency of Social Gatherings with Friends and Family: Once a week  . Attends Religious Services: More than 4 times per year  . Active Member of Clubs or Organizations: No  . Attends Archivist Meetings: More than 4 times per year  . Marital Status: Married    Tobacco Counseling Counseling given: Not Answered   Clinical Intake:  Pre-visit preparation completed: Yes  Pain : No/denies pain Pain Score: 0-No pain     BMI - recorded: 22.16 Nutritional Status: BMI of 19-24  Normal Nutritional Risks: None Diabetes: Yes CBG done?: No Did pt. bring in CBG monitor from home?: No  How often do you need to have someone help you when you read instructions, pamphlets, or other written materials from your doctor or pharmacy?: 1 - Never What is the last grade level you completed in school?: HSG  Diabetic? yes  Interpreter Needed?: No  Information entered by :: Lisette Abu, LPN   Activities of Daily Living In your present state of health, do you have any difficulty performing the following activities: 11/24/2020  Hearing? N  Vision? N  Difficulty concentrating or making decisions? N  Walking or  climbing stairs? N  Dressing or bathing? N  Doing errands, shopping? N  Preparing Food and eating ? N  Using the Toilet? N  In the past six months, have you accidently leaked urine? N  Do you have problems with loss of bowel control? N  Managing your Medications? N  Managing your Finances? N  Housekeeping or managing your Housekeeping? N  Some recent data might be hidden    Patient Care Team: Hoyt Koch, MD as PCP - General (Internal Medicine) Irine Seal, MD as Attending Physician (Urology) Rutherford Guys, MD as Consulting Physician (  Ophthalmology)  Indicate any recent Medical Services you may have received from other than [facility] providers in the past year (date may be approximate).     Assessment:   This is a routine wellness examination for [place].  Hearing/Vision screen No exam data present  Dietary issues and exercise activities discussed: Current Exercise Habits: Home exercise routine (works part-time at [employer] home), Type of exercise: walking, Time (Minutes): 60, Frequency (Times/Week): 7, Weekly Exercise (Minutes/Week): 420, Intensity: Mild  Goals    . Patient Stated     Maintain current health status, stay as active and as independent as possible.    . Patient Stated     I want to maintain my health status, continue to walk and eat healthy. Enjoy life and family.      Depression Screen PHQ 2/9 Scores 11/24/2020 11/24/2019 11/12/2018 11/06/2017 11/06/2017 04/03/2016 01/04/2015  PHQ - 2 Score 0 0 0 0 0 0 0  PHQ- 9 Score - - - 0 - - -    Fall Risk Fall Risk  11/24/2020 11/24/2019 11/12/2018 11/03/2018 11/06/2017  Falls in the past year? 1 1 0 0 No  Comment - - - Emmi Telephone Survey: data to providers prior to load -  Number falls in past yr: 0 0 - - -  Injury with Fall? 1 1 - - -  Comment fractured tailbox - - - -  Follow up Falls evaluation completed Falls prevention discussed - - -    FALL RISK PREVENTION PERTAINING TO THE HOME:  Any stairs in  or around the home? Yes  If so, are there any without handrails? No  Home free of loose throw rugs in walkways, pet beds, electrical cords, etc? Yes  Adequate lighting in your home to reduce risk of falls? Yes   ASSISTIVE DEVICES UTILIZED TO PREVENT FALLS:  Life alert? No  Use of a cane, walker or w/c? No  Grab bars in the bathroom? Yes  Shower chair or bench in shower? Yes  Elevated toilet seat or a handicapped toilet? Yes   TIMED UP AND GO:  Was the test performed? No .  Length of time to ambulate 10 feet: 0 sec.   Gait steady and fast without use of assistive device  Cognitive Function: Normal cognitive status assessed by direct observation by this Nurse Health Advisor. No abnormalities found.   MMSE - Mini Mental State Exam 11/12/2018  Orientation to time 5  Orientation to Place 5  Registration 3  Attention/ Calculation 4  Recall 2  Language- name 2 objects 2  Language- repeat 1  Language- follow 3 step command 3  Language- read & follow direction 1  Write a sentence 1  Copy design 1  Total score 28        Immunizations Immunization History  Administered Date(s) Administered  . Fluad Quad(high Dose 65+) 10/12/2019  . Influenza Split 09/18/2012  . Influenza, High Dose Seasonal PF 09/29/2013, 08/31/2016, 10/07/2018  . Influenza,inj,Quad PF,6+ Mos 10/08/2014, 10/21/2015  . Influenza-Unspecified 09/25/2017  . PFIZER SARS-COV-2 Vaccination 12/22/2019, 01/13/2020  . Pneumococcal Conjugate-13 10/20/2015  . Pneumococcal Polysaccharide-23 07/20/2011  . Td 01/25/2009  . Tdap 06/03/2019    TDAP status: Up to date  Flu Vaccine status: Up to date  Pneumococcal vaccine status: Up to date  Covid-19 vaccine status: Completed vaccines  Qualifies for Shingles Vaccine? Yes   Zostavax completed No   Shingrix Completed?: No.    Education has been provided regarding the importance of this vaccine. Patient  has been advised to call insurance company to determine out of  pocket expense if they have not yet received this vaccine. Advised may also receive vaccine at local pharmacy or Health Dept. Verbalized acceptance and understanding.  Screening Tests Health Maintenance  Topic Date Due  . Hepatitis C Screening  Never done  . HEMOGLOBIN A1C  06/03/2020  . COVID-19 Vaccine (3 - Booster for Pfizer series) 07/12/2020  . INFLUENZA VACCINE  07/17/2020  . OPHTHALMOLOGY EXAM  07/22/2020  . FOOT EXAM  11/23/2020  . TETANUS/TDAP  06/02/2029  . PNA vac Low Risk Adult  Completed    Health Maintenance  Health Maintenance Due  Topic Date Due  . Hepatitis C Screening  Never done  . HEMOGLOBIN A1C  06/03/2020  . COVID-19 Vaccine (3 - Booster for Pfizer series) 07/12/2020  . INFLUENZA VACCINE  07/17/2020  . OPHTHALMOLOGY EXAM  07/22/2020  . FOOT EXAM  11/23/2020    Colorectal cancer screening: Type of screening: Colonoscopy. Completed 10/28/2013. Repeat every 10 years  Lung Cancer Screening: (Low Dose CT Chest recommended if Age 51-80 years, 30 pack-year currently smoking OR have quit w/in 15years.) does not qualify.   Lung Cancer Screening Referral: no  Additional Screening:  Hepatitis C Screening: does qualify; Completed no  Vision Screening: Recommended annual ophthalmology exams for early detection of glaucoma and other disorders of the eye. Is the patient up to date with their annual eye exam?  Yes  Who is the provider or what is the name of the office in which the patient attends annual eye exams? Rutherford Guys, MD If pt is not established with a provider, would they like to be referred to a provider to establish care? No .   Dental Screening: Recommended annual dental exams for proper oral hygiene  Community Resource Referral / Chronic Care Management: CRR required this visit?  No   CCM required this visit?  No      Plan:     I have personally reviewed and noted the following in the patient's chart:   . Medical and social history . Use  of alcohol, tobacco or illicit drugs  . Current medications and supplements . Functional ability and status . Nutritional status . Physical activity . Advanced directives . List of other physicians . Hospitalizations, surgeries, and ER visits in previous 12 months . Vitals . Screenings to include cognitive, depression, and falls . Referrals and appointments  In addition, I have reviewed and discussed with patient certain preventive protocols, quality metrics, and best practice recommendations. A written personalized care plan for preventive services as well as general preventive health recommendations were provided to patient.     Sheral Flow, LPN   30/0/9233   Nurse Notes: n/a

## 2020-11-28 ENCOUNTER — Ambulatory Visit (INDEPENDENT_AMBULATORY_CARE_PROVIDER_SITE_OTHER): Payer: Medicare Other | Admitting: Internal Medicine

## 2020-11-28 ENCOUNTER — Other Ambulatory Visit: Payer: Self-pay

## 2020-11-28 ENCOUNTER — Encounter: Payer: Self-pay | Admitting: Internal Medicine

## 2020-11-28 VITALS — BP 140/76 | HR 84 | Temp 98.2°F | Ht 70.0 in | Wt 165.0 lb

## 2020-11-28 DIAGNOSIS — E1169 Type 2 diabetes mellitus with other specified complication: Secondary | ICD-10-CM | POA: Diagnosis not present

## 2020-11-28 DIAGNOSIS — N400 Enlarged prostate without lower urinary tract symptoms: Secondary | ICD-10-CM

## 2020-11-28 DIAGNOSIS — E785 Hyperlipidemia, unspecified: Secondary | ICD-10-CM

## 2020-11-28 DIAGNOSIS — I10 Essential (primary) hypertension: Secondary | ICD-10-CM | POA: Diagnosis not present

## 2020-11-28 DIAGNOSIS — E1151 Type 2 diabetes mellitus with diabetic peripheral angiopathy without gangrene: Secondary | ICD-10-CM | POA: Diagnosis not present

## 2020-11-28 LAB — CBC
HCT: 43.4 % (ref 39.0–52.0)
Hemoglobin: 14.7 g/dL (ref 13.0–17.0)
MCHC: 33.9 g/dL (ref 30.0–36.0)
MCV: 90.6 fl (ref 78.0–100.0)
Platelets: 120 10*3/uL — ABNORMAL LOW (ref 150.0–400.0)
RBC: 4.79 Mil/uL (ref 4.22–5.81)
RDW: 13.2 % (ref 11.5–15.5)
WBC: 8.9 10*3/uL (ref 4.0–10.5)

## 2020-11-28 LAB — COMPREHENSIVE METABOLIC PANEL
ALT: 14 U/L (ref 0–53)
AST: 16 U/L (ref 0–37)
Albumin: 4.5 g/dL (ref 3.5–5.2)
Alkaline Phosphatase: 50 U/L (ref 39–117)
BUN: 18 mg/dL (ref 6–23)
CO2: 31 mEq/L (ref 19–32)
Calcium: 9.7 mg/dL (ref 8.4–10.5)
Chloride: 103 mEq/L (ref 96–112)
Creatinine, Ser: 0.82 mg/dL (ref 0.40–1.50)
GFR: 85.16 mL/min (ref >60.00)
Glucose, Bld: 120 mg/dL — ABNORMAL HIGH (ref 70–99)
Potassium: 4.7 mEq/L (ref 3.5–5.1)
Sodium: 139 mEq/L (ref 135–145)
Total Bilirubin: 0.7 mg/dL (ref 0.2–1.2)
Total Protein: 6.5 g/dL (ref 6.0–8.3)

## 2020-11-28 LAB — LIPID PANEL
Cholesterol: 158 mg/dL (ref 0–200)
HDL: 59.7 mg/dL (ref >39.00)
LDL Cholesterol: 84 mg/dL (ref 0–99)
NonHDL: 97.8
Total CHOL/HDL Ratio: 3
Triglycerides: 68 mg/dL (ref 0.0–149.0)
VLDL: 13.6 mg/dL (ref 0.0–40.0)

## 2020-11-28 LAB — PSA: PSA: 2.33 ng/mL (ref 0.10–4.00)

## 2020-11-28 LAB — HEMOGLOBIN A1C: Hgb A1c MFr Bld: 7.5 % — ABNORMAL HIGH (ref 4.6–6.5)

## 2020-11-28 MED ORDER — METFORMIN HCL 500 MG PO TABS: ORAL_TABLET | ORAL | 3 refills | Status: DC

## 2020-11-28 MED ORDER — FREESTYLE LITE TEST VI STRP
ORAL_STRIP | 11 refills | Status: DC
Start: 2020-11-28 — End: 2021-08-07

## 2020-11-28 MED ORDER — FREESTYLE LANCETS MISC: 11 refills | Status: DC

## 2020-11-28 MED ORDER — SIMVASTATIN 40 MG PO TABS: 40.0000 mg | ORAL_TABLET | Freq: Every day | ORAL | 3 refills | Status: DC

## 2020-11-28 MED ORDER — BENAZEPRIL HCL 40 MG PO TABS: 40.0000 mg | ORAL_TABLET | Freq: Every day | ORAL | 3 refills | Status: DC

## 2020-11-28 NOTE — Patient Instructions (Signed)
We will check the labs today and let you know about the results.    

## 2020-11-28 NOTE — Progress Notes (Signed)
   Subjective:   Patient ID: Christopher Bradshaw, male    DOB: Oct 20, 1946, 74 y.o.   MRN: [id number]  HPI The patient is a 74 YO man coming in for follow up diabetes (taking metformin and less exercise due to wife's cancer, diet is probably a little worse this year, denies new numbness or tingling) and cholesterol (taking simvastatin, denies side effects or missing doses, denies chest pains or stroke symptoms) and blood pressure (taking benazepril and BP at goal, does not check at home recently, denies headaches or chest pains). Has more stress as wife undergoing breast cancer and treatment for same this year.   Dr. Gershon Crane eyes June 2021  Review of Systems  Constitutional: Negative.   HENT: Negative.   Eyes: Negative.   Respiratory: Negative for cough, chest tightness and shortness of breath.   Cardiovascular: Negative for chest pain, palpitations and leg swelling.  Gastrointestinal: Negative for abdominal distention, abdominal pain, constipation, diarrhea, nausea and vomiting.  Musculoskeletal: Negative.   Skin: Negative.   Neurological: Negative.   Psychiatric/Behavioral: Negative.     Objective:  Physical Exam Constitutional:      Appearance: He is well-developed and well-nourished.  HENT:     Head: Normocephalic and atraumatic.  Eyes:     Extraocular Movements: EOM normal.  Cardiovascular:     Rate and Rhythm: Normal rate and regular rhythm.  Pulmonary:     Effort: Pulmonary effort is normal. No respiratory distress.     Breath sounds: Normal breath sounds. No wheezing or rales.  Abdominal:     General: Bowel sounds are normal. There is no distension.     Palpations: Abdomen is soft.     Tenderness: There is no abdominal tenderness. There is no rebound.  Musculoskeletal:        General: No edema.     Cervical back: Normal range of motion.  Skin:    General: Skin is warm and dry.     Comments: Foot exam done  Neurological:     Mental Status: He is alert and oriented to  person, place, and time.     Coordination: Coordination normal.  Psychiatric:        Mood and Affect: Mood and affect normal.    Vitals:   11/28/20 1340  BP: 140/76  Pulse: 84  Temp: 98.2 F (36.8 C)  TempSrc: Oral  SpO2: 99%  Weight: 165 lb (74.8 kg)  Height: 5\' 10"  (1.778 m)   This visit occurred during the SARS-CoV-2 public health emergency.  Safety protocols were in place, including screening questions prior to the visit, additional usage of staff PPE, and extensive cleaning of exam room while observing appropriate contact time as indicated for disinfecting solutions.   Assessment & Plan:

## 2020-11-29 NOTE — Assessment & Plan Note (Signed)
Checking HgA1c, taking metformin. On ACE-I and statin. Foot exam done. Eye exam up to date. Adjust as needed.

## 2020-11-29 NOTE — Assessment & Plan Note (Signed)
BP at goal on benazepril and checking CMP and adjust as needed.

## 2020-11-29 NOTE — Assessment & Plan Note (Signed)
Checking lipid panel and adjust simvastatin as needed for LDL goal <70.

## 2020-11-29 NOTE — Assessment & Plan Note (Signed)
Checking PSA. Overall stable in the last few years.

## 2021-01-13 ENCOUNTER — Telehealth: Payer: Self-pay | Admitting: Internal Medicine

## 2021-01-13 NOTE — Progress Notes (Signed)
  Chronic Care Management   Note  01/13/2021 Name: Christopher Bradshaw MRN: [id number] DOB: 10-22-1946  Christopher Bradshaw is a 75 y.o. year old male who is a primary care patient of Hoyt Koch, MD. I reached out to Rozelle Logan by phone today in response to a referral sent by Christopher Bradshaw's PCP, Hoyt Koch, MD.   Christopher Bradshaw was given information about Chronic Care Management services today including:  1. CCM service includes personalized support from designated clinical staff supervised by his physician, including individualized plan of care and coordination with other care providers 2. 24/7 contact phone numbers for assistance for urgent and routine care needs. 3. Service will only be billed when office clinical staff spend 20 minutes or more in a month to coordinate care. 4. Only one practitioner may furnish and bill the service in a calendar month. 5. The patient may stop CCM services at any time (effective at the end of the month) by phone call to the office staff.   Patient agreed to services and verbal consent obtained.   Follow up plan:   Carley Perdue [employer] Scheduler

## 2021-02-16 ENCOUNTER — Telehealth: Payer: Self-pay | Admitting: Pharmacist

## 2021-02-16 NOTE — Progress Notes (Signed)
Chronic Care Management Pharmacy Assistant   Name: Christopher Bradshaw  MRN: [id number] DOB: Dec 20, 1945  Reason for Encounter: Initial Questions   PCP : Hoyt Koch, MD  Allergies:  No Known Allergies  Medications: Outpatient Encounter Medications as of 02/16/2021  Medication Sig   acetaminophen (TYLENOL) 325 MG tablet Take 650 mg by mouth every 6 (six) hours as needed.   aspirin 81 MG tablet Take 81 mg by mouth 2 (two) times daily.   benazepril (LOTENSIN) 40 MG tablet Take 1 tablet (40 mg total) by mouth daily.   glucose blood (FREESTYLE LITE) test strip USE TO TEST BLOOD SUGAR ONCE DAILY ICD E11 59   ibuprofen (ADVIL) 200 MG tablet Take 200 mg by mouth every 6 (six) hours as needed.   Lancets (FREESTYLE) lancets USE TO CHECK BLOOD SUGAR ONCE DAILY ICD E11 59   metFORMIN (GLUCOPHAGE) 500 MG tablet TAKE 1 TABLET BY MOUTH TWICE DAILY WITH A MEAL   Omega-3 Fatty Acids (FISH OIL) 1000 MG CAPS Take by mouth.   simvastatin (ZOCOR) 40 MG tablet Take 1 tablet (40 mg total) by mouth at bedtime.   vitamin E 100 UNIT capsule Take 400 Units by mouth daily.   No facility-administered encounter medications on file as of 02/16/2021.    Current Diagnosis: Patient Active Problem List   Diagnosis Date Noted   Routine general medical examination at a health care facility 10/23/2015   TIA (transient ischemic attack) 05/31/2014   Seasonal allergic rhinitis 04/27/2010   NEPHROLITHIASIS, HX OF 04/27/2010   DM (diabetes mellitus), type 2 with peripheral vascular complications ([place]) 19/41/7408   Hyperlipidemia associated with type 2 diabetes mellitus ([place]) 02/13/2008   Benign essential HTN 02/13/2008   RIGHT BUNDLE BRANCH BLOCK 02/13/2008   BPH without obstruction/lower urinary tract symptoms 02/13/2008    Goals Addressed   None     Follow-Up:  Pharmacist Review   Have you seen any other providers since your last visit? The patient has not seen any other providers  since Dr. Sharlet Salina  Any changes in your medications or health? Spoke with patient wife and she states that the patient has not had any changes to medications or health  Any side effects from any medications? Patient wife states that she does not have any side effects to medications  Do you have an symptoms or problems not managed by your medications? Patient wife states that he does not have nay symptoms or problems not managed by medications  Any concerns about your health right now? Patient wife states that he has some trouble with his thumb  Has your provider asked that you check blood pressure, blood sugar, or follow special diet at home? The patient wife states that patient does not check blood pressure but does check his blood sugar and it has been running a little high  Do you get any type of exercise on a regular basis? The wife states that patient likes to walk outside if it is not to cold  Can you think of a goal you would like to reach for your health?The wife states that patient would like to get his blood sugar under control  Do you have any problems getting your medications? The patient has no problems with getting medications from pharmacy  Is there anything that you would like to discuss during the appointment? Wife states to discuss what can be done to help get blood sugar under control  Patient to come in office for first visit,  asked to bring medications and supplements to appointment   Christopher Bradshaw, [place] [phone]   Time spent:30

## 2021-02-20 ENCOUNTER — Other Ambulatory Visit: Payer: Self-pay

## 2021-02-20 ENCOUNTER — Ambulatory Visit (INDEPENDENT_AMBULATORY_CARE_PROVIDER_SITE_OTHER): Payer: Medicare Other | Admitting: Pharmacist

## 2021-02-20 DIAGNOSIS — I10 Essential (primary) hypertension: Secondary | ICD-10-CM | POA: Diagnosis not present

## 2021-02-20 DIAGNOSIS — E1151 Type 2 diabetes mellitus with diabetic peripheral angiopathy without gangrene: Secondary | ICD-10-CM | POA: Diagnosis not present

## 2021-02-20 DIAGNOSIS — E1169 Type 2 diabetes mellitus with other specified complication: Secondary | ICD-10-CM

## 2021-02-20 DIAGNOSIS — E785 Hyperlipidemia, unspecified: Secondary | ICD-10-CM | POA: Diagnosis not present

## 2021-02-20 NOTE — Progress Notes (Unsigned)
Chronic Care Management Pharmacy Note  02/21/2021 Name:  TROOPER OLANDER MRN:  [id number] DOB:  1946/09/08  Subjective: Christopher Bradshaw is an 75 y.o. year old male who is a primary patient of Hoyt Koch, MD.  The CCM team was consulted for assistance with disease management and care coordination needs.    Engaged with patient face to face for initial visit in response to provider referral for pharmacy case management and/or care coordination services.   Consent to Services:  The patient was given the following information about Chronic Care Management services today, agreed to services, and gave verbal consent: 1. CCM service includes personalized support from designated clinical staff supervised by the primary care provider, including individualized plan of care and coordination with other care providers 2. 24/7 contact phone numbers for assistance for urgent and routine care needs. 3. Service will only be billed when office clinical staff spend 20 minutes or more in a month to coordinate care. 4. Only one practitioner may furnish and bill the service in a calendar month. 5.The patient may stop CCM services at any time (effective at the end of the month) by phone call to the office staff. 6. The patient will be responsible for cost sharing (co-pay) of up to 20% of the service fee (after annual deductible is met). Patient agreed to services and consent obtained.  Patient Care Team: Hoyt Koch, MD as PCP - General (Internal Medicine) Irine Seal, MD as Attending Physician (Urology) Rutherford Guys, MD as Consulting Physician (Ophthalmology) Charlton Haws, [facility] as Pharmacist (Pharmacist)  Recent office visits: 11/28/20 Dr Sharlet Salina OV: chronic f/u, lipids stable, A1c worse but still < 8%. No med changes.  Recent consult visits: None  Hospital visits: None in previous 6 months  Objective:  Lab Results  Component Value Date   CREATININE 0.82 11/28/2020   BUN 18  11/28/2020   GFR 85.16 11/28/2020   GFRNONAA 89.89 07/25/2009   NA 139 11/28/2020   K 4.7 11/28/2020   CALCIUM 9.7 11/28/2020   CO2 31 11/28/2020    Lab Results  Component Value Date/Time   HGBA1C 7.5 (H) 11/28/2020 02:07 PM   HGBA1C 7.1 (H) 12/04/2019 09:33 AM   GFR 85.16 11/28/2020 02:07 PM   GFR 94.04 12/04/2019 09:33 AM   MICROALBUR 0.4 10/08/2014 10:19 AM   MICROALBUR 0.8 09/29/2013 10:14 AM    Last diabetic Eye exam:  Lab Results  Component Value Date/Time   HMDIABEYEEXA No Retinopathy 10/18/2017 12:00 AM    Last diabetic Foot exam: No results found for: HMDIABFOOTEX   Lab Results  Component Value Date   CHOL 158 11/28/2020   HDL 59.70 11/28/2020   LDLCALC 84 11/28/2020   TRIG 68.0 11/28/2020   CHOLHDL 3 11/28/2020    Hepatic Function Latest Ref Rng & Units 11/28/2020 12/04/2019 12/01/2018  Total Protein 6.0 - 8.3 g/dL 6.5 7.1 6.6  Albumin 3.5 - 5.2 g/dL 4.5 4.6 4.3  AST 0 - 37 U/L $Remo'16 14 17  'ZHtBT$ ALT 0 - 53 U/L $Remo'14 12 14  'IkyKJ$ Alk Phosphatase 39 - 117 U/L 50 129(H) 43  Total Bilirubin 0.2 - 1.2 mg/dL 0.7 0.7 0.9  Bilirubin, Direct 0.0 - 0.3 mg/dL - - -    Lab Results  Component Value Date/Time   TSH 3.82 04/03/2016 08:52 AM   TSH 3.35 10/08/2014 10:19 AM    CBC Latest Ref Rng & Units 11/28/2020 12/04/2019 12/01/2018  WBC 4.0 - 10.5 K/uL 8.9 8.3 7.4  Hemoglobin  13.0 - 17.0 g/dL 14.7 15.1 15.2  Hematocrit 39.0 - 52.0 % 43.4 44.5 45.1  Platelets 150.0 - 400.0 K/uL 120.0(L) 117.0(L) 132.0(L)    No results found for: VD25OH  Clinical ASCVD: No  The 10-year ASCVD risk score Mikey Bussing DC Jr., et al., 2013) is: 53.6%   Values used to calculate the score:     Age: 41 years     Sex: Male     Is Non-Hispanic African American: No     Diabetic: Yes     Tobacco smoker: No     Systolic Blood Pressure: 737 mmHg     Is BP treated: Yes     HDL Cholesterol: 59.7 mg/dL     Total Cholesterol: 158 mg/dL    Depression screen [facility] 2/9 11/24/2020 11/24/2019 11/12/2018  Decreased  Interest 0 0 0  Down, Depressed, Hopeless 0 0 0  PHQ - 2 Score 0 0 0  Altered sleeping - - -  Tired, decreased energy - - -  Change in appetite - - -  Feeling bad or failure about yourself  - - -  Trouble concentrating - - -  Moving slowly or fidgety/restless - - -  Suicidal thoughts - - -  PHQ-9 Score - - -  Difficult doing work/chores - - -     Social History   Tobacco Use  Smoking Status Never Smoker  Smokeless Tobacco Never Used   BP Readings from Last 3 Encounters:  11/28/20 140/76  11/24/20 (!) 160/80  03/30/20 (!) 162/86   Pulse Readings from Last 3 Encounters:  11/28/20 84  11/24/20 66  03/30/20 70   Wt Readings from Last 3 Encounters:  11/28/20 165 lb (74.8 kg)  11/24/20 163 lb 6.4 oz (74.1 kg)  03/30/20 169 lb 6.4 oz (76.8 kg)    Assessment/Interventions: Review of patient past medical history, allergies, medications, health status, including review of consultants reports, laboratory and other test data, was performed as part of comprehensive evaluation and provision of chronic care management services.   SDOH:  (Social Determinants of Health) assessments and interventions performed: Yes  SDOH Screenings   Alcohol Screen: Low Risk   . Last Alcohol Screening Score (AUDIT): 1  Depression (PHQ2-9): Low Risk   . PHQ-2 Score: 0  Financial Resource Strain: Low Risk   . Difficulty of Paying Living Expenses: Not hard at all  Food Insecurity: No Food Insecurity  . Worried About Charity fundraiser in the Last Year: Never true  . Ran Out of Food in the Last Year: Never true  Housing: Low Risk   . Last Housing Risk Score: 0  Physical Activity: Sufficiently Active  . Days of Exercise per Week: 7 days  . Minutes of Exercise per Session: 60 min  Social Connections: Socially Integrated  . Frequency of Communication with Friends and Family: More than three times a week  . Frequency of Social Gatherings with Friends and Family: Once a week  . Attends Religious  Services: More than 4 times per year  . Active Member of Clubs or Organizations: No  . Attends Archivist Meetings: More than 4 times per year  . Marital Status: Married  Stress: No Stress Concern Present  . Feeling of Stress : Not at all  Tobacco Use: Low Risk   . Smoking Tobacco Use: Never Smoker  . Smokeless Tobacco Use: Never Used  Transportation Needs: No Transportation Needs  . Lack of Transportation (Medical): No  . Lack of Transportation (Non-Medical):  No    CCM Care Plan  No Known Allergies  Medications Reviewed Today    Reviewed by Charlton Haws, [facility] (Pharmacist) on 02/21/21 at [place] List Status: <None>  Medication Order Taking? Sig Documenting Provider Last Dose Status Informant  acetaminophen (TYLENOL) 325 MG tablet [id number] Yes Take 650 mg by mouth every 6 (six) hours as needed. [provider] Taking Active Self  aspirin 81 MG tablet [id number] Yes Take 81 mg by mouth 2 (two) times daily. [provider] Taking Active Self  benazepril (LOTENSIN) 40 MG tablet [id number] Yes Take 1 tablet (40 mg total) by mouth daily. Hoyt Koch, MD Taking Active   glucose blood (FREESTYLE LITE) test strip [id number] Yes USE TO TEST BLOOD SUGAR ONCE DAILY ICD E11 21 Hoyt Koch, MD Taking Active   ibuprofen (ADVIL) 200 MG tablet [id number] Yes Take 200 mg by mouth every 6 (six) hours as needed. [provider] Taking Active Self  Lancets (FREESTYLE) lancets [id number] Yes USE TO CHECK BLOOD SUGAR ONCE DAILY ICD E11 59 Hoyt Koch, MD Taking Active   metFORMIN (GLUCOPHAGE) 500 MG tablet [id number] Yes TAKE 1 TABLET BY MOUTH TWICE DAILY WITH A MEAL Hoyt Koch, MD Taking Active   Omega-3 Fatty Acids (FISH OIL) 1000 MG CAPS [id number] Yes Take by mouth. [provider] Taking Active   simvastatin (ZOCOR) 40 MG tablet [id number] Yes Take 1 tablet (40 mg total) by mouth at bedtime. Hoyt Koch, MD  Taking Active   vitamin E 100 UNIT capsule [id number] Yes Take 400 Units by mouth daily. [provider] Taking Active           Patient Active Problem List   Diagnosis Date Noted  . Routine general medical examination at a health care facility 10/23/2015  . TIA (transient ischemic attack) 05/31/2014  . Seasonal allergic rhinitis 04/27/2010  . NEPHROLITHIASIS, HX OF 04/27/2010  . DM (diabetes mellitus), type 2 with peripheral vascular complications ([place]) 94/70/9628  . Hyperlipidemia associated with type 2 diabetes mellitus ([place]) 02/13/2008  . Benign essential HTN 02/13/2008  . RIGHT BUNDLE BRANCH BLOCK 02/13/2008  . BPH without obstruction/lower urinary tract symptoms 02/13/2008    Immunization History  Administered Date(s) Administered  . Fluad Quad(high Dose 65+) 10/12/2019  . Influenza Split 09/18/2012  . Influenza, High Dose Seasonal PF 09/29/2013, 08/31/2016, 10/07/2018  . Influenza,inj,Quad PF,6+ Mos 10/08/2014, 10/21/2015  . Influenza-Unspecified 09/25/2017, 09/19/2020  . PFIZER(Purple Top)SARS-COV-2 Vaccination 12/22/2019, 01/13/2020, 09/23/2020  . Pneumococcal Conjugate-13 10/20/2015  . Pneumococcal Polysaccharide-23 07/20/2011  . Td 01/25/2009  . Tdap 06/03/2019    Conditions to be addressed/monitored:  Hypertension, Hyperlipidemia and Diabetes  Care Plan : [place]  Updates made by Charlton Haws, RPH since 02/21/2021 12:00 AM    Problem: Hypertension, Hyperlipidemia and Diabetes   Priority: High    Long-Range Goal: Disease management   Start Date: 02/21/2021  Expected End Date: 02/21/2022  This Visit's Progress: On track  Priority: High  Note:   Current Barriers:  . Unable to independently monitor therapeutic efficacy  Pharmacist Clinical Goal(s):  Marland Kitchen Over the next 365 days, patient will achieve adherence to monitoring guidelines and medication adherence to achieve therapeutic efficacy through collaboration with PharmD and provider.    Interventions: . 1:1 collaboration with Hoyt Koch, MD regarding development and update of comprehensive plan of care as evidenced by provider attestation and co-signature . Inter-disciplinary care team collaboration (see longitudinal plan of care) . Comprehensive  medication review performed; medication list updated in electronic medical record  Hypertension (BP goal <130/80) -Controlled - pt reports BP at home is < 130/80 -Current treatment: . Benazepril 40 mg daily -Current home readings: 128/80 -Current exercise habits: pt works part time at a funeral home, he is on his feet most hours of the day -Denies hypotensive/hypertensive symptoms -Educated on BP goals and benefits of medications for prevention of heart attack, stroke and kidney damage; Daily salt intake goal < 2300 mg; Importance of home blood pressure monitoring; Symptoms of hypotension and importance of maintaining adequate hydration; -Counseled to monitor BP at home weekly, document, and provide log at future appointments -Recommended to continue current medication  Hyperlipidemia: (LDL goal < 100) -Controlled - LDL is at goal. Pt does report leg cramps, but only on days that he works/stands up most hours of the day -Current treatment: . Simvastatin 40 mg daily HS . Omega-3 Fish oil 1000 mg . Aspirin 81 mg daily -Educated on Cholesterol goals;  Benefits of statin for ASCVD risk reduction;  -Counseled muscle cramp side effect of statins - pt's cramps appear related to activity and are improved with hydration; advised to drink more water on days he is working; if cramps worsen may consider switching statins in the future -Recommended to continue current medication  Diabetes (A1c goal <8%) -Controlled - A1c at goal; pt reports BG is usually better controlled in summer when he can walk outside more -Current medications: Marland Kitchen Metformin 500 mg BID . Testing supplies -Current home glucose readings: not  checking -Denies hypoglycemic/hyperglycemic symptoms -Current exercise: walks 1 hour in AM when weather is nice -Educated onA1c and blood sugar goals; Complications of diabetes including kidney damage, retinal damage, and cardiovascular disease; -Counseled to check feet daily and get yearly eye exams -Recommended to continue current medication  Health Maintenance -Vaccine gaps: Shingrix  -Current therapy:  Marland Kitchen Vitamin E 400 IU . Ibuprofen 200 mg  . Tylenol 325 mg -Patient is satisfied with current therapy and denies issues -Recommended Shingrix vaccine at local pharmacy  Patient Goals/Self-Care Activities . Over the next 365 days, patient will:  - take medications as prescribed focus on medication adherence by pill box check blood pressure weekly, document, and provide at future appointments  Follow Up Plan: Telephone follow up appointment with care management team member scheduled for: 1 year      Medication Assistance: None required.  Patient affirms current coverage meets needs.  Patient's preferred pharmacy is:  [facility] [address] [place] [address]  [place] [address] Phone: [phone] Fax: [phone]  Uses pill box? Yes Pt endorses 100% compliance  We discussed: Current pharmacy is preferred with insurance plan and patient is satisfied with pharmacy services Patient decided to: Continue current medication management strategy  Care Plan and Follow Up Patient Decision:  Patient agrees to Care Plan and Follow-up.  Plan: Telephone follow up appointment with care management team member scheduled for:  1 year  Charlene Brooke, PharmD, [facility] Clinical Pharmacist [facility] at [facility] [phone]

## 2021-02-21 NOTE — Patient Instructions (Addendum)
Visit Information  Phone number for Pharmacist: [phone]  Thank you for meeting with me to discuss your medications! I look forward to working with you to achieve your health care goals. Below is a summary of what we talked about during the visit:  Goals Addressed            This Visit's Progress   . Manage My Medicine       Timeframe:  Long-Range Goal Priority:  Medium Start Date:     02/21/21                        Expected End Date:     02/21/22                  Follow Up Date 08/19/21   - call for medicine refill 2 or 3 days before it runs out - call if I am sick and can't take my medicine - keep a list of all the medicines I take; vitamins and herbals too - use a pillbox to sort medicine  - Drink more water on work days to prevent muscle cramps   Why is this important?   . These steps will help you keep on track with your medicines.   Notes:       Patient Care Plan: CCM Pharmacy Care Plan    Problem Identified: Hypertension, Hyperlipidemia and Diabetes   Priority: High    Long-Range Goal: Disease management   Start Date: 02/21/2021  Expected End Date: 02/21/2022  This Visit's Progress: On track  Priority: High  Note:   Current Barriers:  . Unable to independently monitor therapeutic efficacy  Pharmacist Clinical Goal(s):  Marland Kitchen Over the next 365 days, patient will achieve adherence to monitoring guidelines and medication adherence to achieve therapeutic efficacy through collaboration with PharmD and provider.   Interventions: . 1:1 collaboration with Hoyt Koch, MD regarding development and update of comprehensive plan of care as evidenced by provider attestation and co-signature . Inter-disciplinary care team collaboration (see longitudinal plan of care) . Comprehensive medication review performed; medication list updated in electronic medical record  Hypertension (BP goal <130/80) -Controlled - pt reports BP at home is < 130/80 -Current  treatment: . Benazepril 40 mg daily -Current home readings: 128/80 -Current exercise habits: pt works part time at a funeral home, he is on his feet most hours of the day -Denies hypotensive/hypertensive symptoms -Educated on BP goals and benefits of medications for prevention of heart attack, stroke and kidney damage; Daily salt intake goal < 2300 mg; Importance of home blood pressure monitoring; Symptoms of hypotension and importance of maintaining adequate hydration; -Counseled to monitor BP at home weekly, document, and provide log at future appointments -Recommended to continue current medication  Hyperlipidemia: (LDL goal < 100) -Controlled - LDL is at goal. Pt does report leg cramps, but only on days that he works/stands up most hours of the day -Current treatment: . Simvastatin 40 mg daily HS . Omega-3 Fish oil 1000 mg . Aspirin 81 mg daily -Educated on Cholesterol goals;  Benefits of statin for ASCVD risk reduction;  -Counseled muscle cramp side effect of statins - pt's cramps appear related to activity and are improved with hydration; advised to drink more water on days he is working; if cramps worsen may consider switching statins in the future -Recommended to continue current medication  Diabetes (A1c goal <8%) -Controlled - A1c at goal; pt reports BG is usually better controlled in  summer when he can walk outside more -Current medications: Marland Kitchen Metformin 500 mg BID . Testing supplies -Current home glucose readings: not checking -Denies hypoglycemic/hyperglycemic symptoms -Current exercise: walks 1 hour in AM when weather is nice -Educated onA1c and blood sugar goals; Complications of diabetes including kidney damage, retinal damage, and cardiovascular disease; -Counseled to check feet daily and get yearly eye exams -Recommended to continue current medication  Health Maintenance -Vaccine gaps: Shingrix  -Current therapy:  Marland Kitchen Vitamin E 400 IU . Ibuprofen 200 mg   . Tylenol 325 mg -Patient is satisfied with current therapy and denies issues -Recommended Shingrix vaccine at local pharmacy  Patient Goals/Self-Care Activities . Over the next 365 days, patient will:  - take medications as prescribed focus on medication adherence by pill box check blood pressure weekly, document, and provide at future appointments  Follow Up Plan: Telephone follow up appointment with care management team member scheduled for: 1 year      Christopher Bradshaw was given information about Chronic Care Management services today including:  1. CCM service includes personalized support from designated clinical staff supervised by his physician, including individualized plan of care and coordination with other care providers 2. 24/7 contact phone numbers for assistance for urgent and routine care needs. 3. Standard insurance, coinsurance, copays and deductibles apply for chronic care management only during months in which we provide at least 20 minutes of these services. Most insurances cover these services at 100%, however patients may be responsible for any copay, coinsurance and/or deductible if applicable. This service may help you avoid the need for more expensive face-to-face services. 4. Only one practitioner may furnish and bill the service in a calendar month. 5. The patient may stop CCM services at any time (effective at the end of the month) by phone call to the office staff.  Patient agreed to services and verbal consent obtained.   Patient verbalizes understanding of instructions provided today and agrees to view in [place].  Telephone follow up appointment with pharmacy team member scheduled for: 1 year  Charlene Brooke, PharmD, BCACP Clinical Pharmacist [facility] at [facility] [phone]  Leg Cramps Leg cramps occur when one or more muscles tighten and a person has no control over it (involuntary muscle contraction). Muscle cramps are most common in  the calf muscles of the leg. They can occur during exercise or at rest. Leg cramps are painful, and they may last for a few seconds to a few minutes. Cramps may return several times before they finally stop. Usually, leg cramps are not caused by a serious medical problem. In many cases, the cause is not known. Some common causes include:  Excessive physical effort (overexertion), such as during intense exercise.  Doing the same motion over and over.  Staying in a certain position for a long period of time.  Improper preparation, form, or technique while doing a sport or an activity.  Dehydration.  Injury.  Side effects of certain medicines.  Abnormally low levels of minerals in your blood (electrolytes), especially potassium and calcium. This could result from: ? Pregnancy. ? Taking diuretic medicines. Follow these instructions at home: Eating and drinking  Drink enough fluid to keep your urine pale yellow. Staying hydrated may help prevent cramps.  Eat a healthy diet that includes plenty of nutrients to help your muscles function. A healthy diet includes fruits and vegetables, lean protein, whole grains, and low-fat or nonfat dairy products. Managing pain, stiffness, and swelling  Try massaging, stretching, and  relaxing the affected muscle. Do this for several minutes at a time.  If directed, put ice on areas that are sore or painful after a cramp. To do this: ? Put ice in a plastic bag. ? Place a towel between your skin and the bag. ? Leave the ice on for 20 minutes, 2-3 times a day. ? Remove the ice if your skin turns bright red. This is very important. If you cannot feel pain, heat, or cold, you have a greater risk of damage to the area.  If directed, apply heat to muscles that are tense or tight. Do this before you exercise, or as often as told by your health care provider. Use the heat source that your health care provider recommends, such as a moist heat pack or a heating  pad. To do this: ? Place a towel between your skin and the heat source. ? Leave the heat on for 20-30 minutes. ? Remove the heat if your skin turns bright red. This is especially important if you are unable to feel pain, heat, or cold. You may have a greater risk of getting burned.  Try taking hot showers or baths to help relax tight muscles.      General instructions  If you are having frequent leg cramps, avoid intense exercise for several days.  Take over-the-counter and prescription medicines only as told by your health care provider.  Keep all follow-up visits. This is important. Contact a health care provider if:  Your leg cramps get more severe or more frequent, or they do not improve over time.  Your foot becomes cold, numb, or blue. Summary  Muscle cramps can develop in any muscle, but the most common place is in the calf muscles of the leg.  Leg cramps are painful, and they may last for a few seconds to a few minutes.  Usually, leg cramps are not caused by a serious medical problem. Often, the cause is not known.  Stay hydrated, and take over-the-counter and prescription medicines only as told by your health care provider. This information is not intended to replace advice given to you by your health care provider. Make sure you discuss any questions you have with your health care provider. Document Revised: 04/20/2020 Document Reviewed: 04/20/2020 Elsevier Patient Education  [place].

## 2021-04-26 ENCOUNTER — Telehealth: Payer: Self-pay | Admitting: Pharmacist

## 2021-04-26 NOTE — Progress Notes (Signed)
Recent Relevant Labs: Lab Results  Component Value Date/Time   HGBA1C 7.5 (H) 11/28/2020 02:07 PM   HGBA1C 7.1 (H) 12/04/2019 09:33 AM   MICROALBUR 0.4 10/08/2014 10:19 AM   MICROALBUR 0.8 09/29/2013 10:14 AM    Kidney Function Lab Results  Component Value Date/Time   CREATININE 0.82 11/28/2020 02:07 PM   CREATININE 0.80 12/04/2019 09:33 AM   GFR 85.16 11/28/2020 02:07 PM   GFRNONAA 89.89 07/25/2009 09:56 AM    . Current antihyperglycemic regimen:  Metformin 500 mg BID  . What recent interventions/DTPs have been made to improve glycemic control:  To check feet daily, educated on A1c and blood sugar goals, continue current medication  . Have there been any recent hospitalizations or ED visits since last visit with CPP? No   . Patient denies hypoglycemic symptoms, including None . Patient reports reading of 250 while at a funeral but had little to eat that day hyperglycemic symptoms, including none   . How often are you checking your blood sugar? once daily   . What are your blood sugars ranging? 180-200 o Fasting:  o Before meals: 218 yesterday o After meals:  o Bedtime:   . During the week, how often does your blood glucose drop below 70? Never   . Are you checking your feet daily/regularly? Patient wife states that patient has not problems with feet  Adherence Review: Is the patient currently on a STATIN medication? Yes, simvastatin Is the patient currently on ACE/ARB medication? No Does the patient have >5 day gap between last estimated fill dates? No   Ethelene Hal Clinical Pharmacist Assistant [phone]  Time spent:40

## 2021-04-26 NOTE — Progress Notes (Signed)
Chronic Care Management Pharmacy Assistant   Name: Christopher Bradshaw  MRN: [id number] DOB: [birth year]    Reason for Encounter: Disease State Hypertension Call   Conditions to be addressed/monitored: HTN  Recent office visits:  None ID  Recent consult visits:  None ID  Hospital visits:  None in previous 6 months  Medications: Outpatient Encounter Medications as of 04/26/2021  Medication Sig  . acetaminophen (TYLENOL) 325 MG tablet Take 650 mg by mouth every 6 (six) hours as needed.  Marland Kitchen aspirin 81 MG tablet Take 81 mg by mouth 2 (two) times daily.  . benazepril (LOTENSIN) 40 MG tablet Take 1 tablet (40 mg total) by mouth daily.  Marland Kitchen glucose blood (FREESTYLE LITE) test strip USE TO TEST BLOOD SUGAR ONCE DAILY ICD E11 59  . ibuprofen (ADVIL) 200 MG tablet Take 200 mg by mouth every 6 (six) hours as needed.  . Lancets (FREESTYLE) lancets USE TO CHECK BLOOD SUGAR ONCE DAILY ICD E11 59  . metFORMIN (GLUCOPHAGE) 500 MG tablet TAKE 1 TABLET BY MOUTH TWICE DAILY WITH A MEAL  . Omega-3 Fatty Acids (FISH OIL) 1000 MG CAPS Take by mouth.  . simvastatin (ZOCOR) 40 MG tablet Take 1 tablet (40 mg total) by mouth at bedtime.  . vitamin E 100 UNIT capsule Take 400 Units by mouth daily.   No facility-administered encounter medications on file as of 04/26/2021.   Reviewed chart prior to disease state call. Spoke with patient regarding BP  Recent Office Vitals: BP Readings from Last 3 Encounters:  11/28/20 140/76  11/24/20 (!) 160/80  03/30/20 (!) 162/86   Pulse Readings from Last 3 Encounters:  11/28/20 84  11/24/20 66  03/30/20 70    Wt Readings from Last 3 Encounters:  11/28/20 165 lb (74.8 kg)  11/24/20 163 lb 6.4 oz (74.1 kg)  03/30/20 169 lb 6.4 oz (76.8 kg)     Kidney Function Lab Results  Component Value Date/Time   CREATININE 0.82 11/28/2020 02:07 PM   CREATININE 0.80 12/04/2019 09:33 AM   GFR 85.16 11/28/2020 02:07 PM   GFRNONAA 89.89 07/25/2009 09:56 AM    BMP Latest  Ref Rng & Units 11/28/2020 12/04/2019 12/01/2018  Glucose 70 - 99 mg/dL 120(H) 130(H) 116(H)  BUN 6 - 23 mg/dL 18 19 19   Creatinine 0.40 - 1.50 mg/dL 0.82 0.80 0.75  Sodium 135 - 145 mEq/L 139 140 138  Potassium 3.5 - 5.1 mEq/L 4.7 4.9 4.7  Chloride 96 - 112 mEq/L 103 104 103  CO2 19 - 32 mEq/L 31 30 28   Calcium 8.4 - 10.5 mg/dL 9.7 10.0 9.5    . Current antihypertensive regimen:  Benazepril 40 mg twice daily  . How often are you checking your Blood Pressure? 1-2x per week   . Current home BP readings: Patient has not taken in a few days but believes it has been a little elevated because he has not been able to walk. His last reading 140/46 at Dr. Nathanial Millman office   . What recent interventions/DTPs have been made by any provider to improve Blood Pressure control since last CPP Visit: Patient to monitor blood pressure and keep log, continue current medication  . Any recent hospitalizations or ED visits since last visit with CPP? No   . What diet changes have been made to improve Blood Pressure Control?  Patient wife stated that patient has not made any changes to his diet but they are working to get him to eat more vegetables  .  What exercise is being done to improve your Blood Pressure Control?  Patient wife stated that the patient walks daily for about an hour but the last few weeks not so because of the cool weather  Adherence Review: Is the patient currently on ACE/ARB medication? Yes, Benazepril Does the patient have >5 day gap between last estimated fill dates? No  Star Rating Drugs: Benazepril 03/06/21 90 ds Simvastatin 01/24/21 90 ds Metformin 01/24/21 90 ds  [place] Pharmacist Assistant [phone]

## 2021-06-23 ENCOUNTER — Telehealth: Payer: Self-pay | Admitting: Pharmacist

## 2021-06-23 NOTE — Progress Notes (Signed)
Chronic Care Management Pharmacy Assistant   Name: Christopher Bradshaw  MRN: [id number] DOB: 05/29/1946   Reason for Encounter: Disease State   Conditions to be addressed/monitored: HTN   Recent office visits:  None ID   Recent consult visits:  None ID  Hospital visits:  None in previous 6 months  Medications: Outpatient Encounter Medications as of 06/23/2021  Medication Sig   acetaminophen (TYLENOL) 325 MG tablet Take 650 mg by mouth every 6 (six) hours as needed.   aspirin 81 MG tablet Take 81 mg by mouth 2 (two) times daily.   benazepril (LOTENSIN) 40 MG tablet Take 1 tablet (40 mg total) by mouth daily.   glucose blood (FREESTYLE LITE) test strip USE TO TEST BLOOD SUGAR ONCE DAILY ICD E11 59   ibuprofen (ADVIL) 200 MG tablet Take 200 mg by mouth every 6 (six) hours as needed.   Lancets (FREESTYLE) lancets USE TO CHECK BLOOD SUGAR ONCE DAILY ICD E11 59   metFORMIN (GLUCOPHAGE) 500 MG tablet TAKE 1 TABLET BY MOUTH TWICE DAILY WITH A MEAL   Omega-3 Fatty Acids (FISH OIL) 1000 MG CAPS Take by mouth.   simvastatin (ZOCOR) 40 MG tablet Take 1 tablet (40 mg total) by mouth at bedtime.   vitamin E 100 UNIT capsule Take 400 Units by mouth daily.   No facility-administered encounter medications on file as of 06/23/2021.   Pharmacist Review  Reviewed chart prior to disease state call. Spoke with patient regarding BP  Recent Office Vitals: BP Readings from Last 3 Encounters:  11/28/20 140/76  11/24/20 (!) 160/80  03/30/20 (!) 162/86   Pulse Readings from Last 3 Encounters:  11/28/20 84  11/24/20 66  03/30/20 70    Wt Readings from Last 3 Encounters:  11/28/20 165 lb (74.8 kg)  11/24/20 163 lb 6.4 oz (74.1 kg)  03/30/20 169 lb 6.4 oz (76.8 kg)     Kidney Function Lab Results  Component Value Date/Time   CREATININE 0.82 11/28/2020 02:07 PM   CREATININE 0.80 12/04/2019 09:33 AM   GFR 85.16 11/28/2020 02:07 PM   GFRNONAA 89.89 07/25/2009 09:56 AM    BMP Latest Ref Rng  & Units 11/28/2020 12/04/2019 12/01/2018  Glucose 70 - 99 mg/dL 120(H) 130(H) 116(H)  BUN 6 - 23 mg/dL 18 19 19   Creatinine 0.40 - 1.50 mg/dL 0.82 0.80 0.75  Sodium 135 - 145 mEq/L 139 140 138  Potassium 3.5 - 5.1 mEq/L 4.7 4.9 4.7  Chloride 96 - 112 mEq/L 103 104 103  CO2 19 - 32 mEq/L 31 30 28   Calcium 8.4 - 10.5 mg/dL 9.7 10.0 9.5    Current antihypertensive regimen:  Benazepril 40 mg 1 tab daily  How often are you checking your Blood Pressure? infrequently, spoke with patient wife who states he is not checking blood pressure because it is usually normal, but will have patient start checking and have reading to give next month  Current home BP readings: Patient has not home reading  What recent interventions/DTPs have been made by any provider to improve Blood Pressure control since last CPP Visit: Continue current medications, per CPP 02/20/21  Any recent hospitalizations or ED visits since last visit with CPP? No What diet changes have been made to improve Blood Pressure Control?  Wife states that patient has not made any changes to diet, eats what he wants  What exercise is being done to improve your Blood Pressure Control?  Wife states that patient walks every morning for about  1 hour  Adherence Review: Is the patient currently on ACE/ARB medication? Yes, benazepril 06/04/21 90 ds  Does the patient have >5 day gap between last estimated fill dates? No  [place] Pharmacist Assistant [phone]   Time spent:29

## 2021-08-03 DIAGNOSIS — Z961 Presence of intraocular lens: Secondary | ICD-10-CM | POA: Diagnosis not present

## 2021-08-03 DIAGNOSIS — E119 Type 2 diabetes mellitus without complications: Secondary | ICD-10-CM | POA: Diagnosis not present

## 2021-08-03 LAB — HM DIABETES EYE EXAM

## 2021-08-07 ENCOUNTER — Telehealth: Payer: Self-pay | Admitting: Internal Medicine

## 2021-08-07 DIAGNOSIS — E1151 Type 2 diabetes mellitus with diabetic peripheral angiopathy without gangrene: Secondary | ICD-10-CM

## 2021-08-07 MED ORDER — FREESTYLE LITE TEST VI STRP: ORAL_STRIP | 11 refills | Status: DC

## 2021-08-07 NOTE — Telephone Encounter (Signed)
Medication has been sent to the patient's pharmacy.  

## 2021-08-15 DIAGNOSIS — L918 Other hypertrophic disorders of the skin: Secondary | ICD-10-CM | POA: Diagnosis not present

## 2021-08-29 ENCOUNTER — Encounter: Payer: Self-pay | Admitting: Internal Medicine

## 2021-09-05 ENCOUNTER — Telehealth: Payer: Self-pay | Admitting: Pharmacist

## 2021-09-05 NOTE — Progress Notes (Signed)
Chronic Care Management Pharmacy Assistant   Name: Christopher Bradshaw  MRN: [id number] DOB: November 16, 1946   Reason for Encounter: Disease State   Conditions to be addressed/monitored: HTN   Recent office visits:  None ID  Recent consult visits:  08/03/21 Hyman Hopes, MD- [facility] visits:  None in previous 6 months  Medications: Outpatient Encounter Medications as of 09/05/2021  Medication Sig   acetaminophen (TYLENOL) 325 MG tablet Take 650 mg by mouth every 6 (six) hours as needed.   aspirin 81 MG tablet Take 81 mg by mouth 2 (two) times daily.   benazepril (LOTENSIN) 40 MG tablet Take 1 tablet (40 mg total) by mouth daily.   glucose blood (FREESTYLE LITE) test strip USE TO TEST BLOOD SUGAR ONCE DAILY ICD E11 59   ibuprofen (ADVIL) 200 MG tablet Take 200 mg by mouth every 6 (six) hours as needed.   Lancets (FREESTYLE) lancets USE TO CHECK BLOOD SUGAR ONCE DAILY ICD E11 59   metFORMIN (GLUCOPHAGE) 500 MG tablet TAKE 1 TABLET BY MOUTH TWICE DAILY WITH A MEAL   Omega-3 Fatty Acids (FISH OIL) 1000 MG CAPS Take by mouth.   simvastatin (ZOCOR) 40 MG tablet Take 1 tablet (40 mg total) by mouth at bedtime.   vitamin E 100 UNIT capsule Take 400 Units by mouth daily.   No facility-administered encounter medications on file as of 09/05/2021.    Recent Office Vitals: BP Readings from Last 3 Encounters:  11/28/20 140/76  11/24/20 (!) 160/80  03/30/20 (!) 162/86   Pulse Readings from Last 3 Encounters:  11/28/20 84  11/24/20 66  03/30/20 70    Wt Readings from Last 3 Encounters:  11/28/20 165 lb (74.8 kg)  11/24/20 163 lb 6.4 oz (74.1 kg)  03/30/20 169 lb 6.4 oz (76.8 kg)     Kidney Function Lab Results  Component Value Date/Time   CREATININE 0.82 11/28/2020 02:07 PM   CREATININE 0.80 12/04/2019 09:33 AM   GFR 85.16 11/28/2020 02:07 PM   GFRNONAA 89.89 07/25/2009 09:56 AM    BMP Latest Ref Rng & Units 11/28/2020 12/04/2019 12/01/2018   Glucose 70 - 99 mg/dL 120(H) 130(H) 116(H)  BUN 6 - 23 mg/dL 18 19 19   Creatinine 0.40 - 1.50 mg/dL 0.82 0.80 0.75  Sodium 135 - 145 mEq/L 139 140 138  Potassium 3.5 - 5.1 mEq/L 4.7 4.9 4.7  Chloride 96 - 112 mEq/L 103 104 103  CO2 19 - 32 mEq/L 31 30 28   Calcium 8.4 - 10.5 mg/dL 9.7 10.0 9.5     Contacted patient on 09/05/21 to discuss hypertension disease state  Current antihypertensive regimen:  Benazepril 40 mg daily  Patient verbally confirms he is taking the above medications as directed. Yes, patient stated that he stopped taking when his blood pressure came came , but recently has started taking 1/2 daily daily  How often are you checking your Blood Pressure? when feeling symptomatic, have not checked this week  he checks his blood pressure in the morning before taking his medication.  Current home BP readings: 109/68,123/67,118/61,108/62,110/53 are some of his reading from this month  Wrist or arm cuff:arm cuff Caffeine intake:1 cup in the morning Salt intake:yes, he some salt to foods OTC medications including pseudoephedrine or NSAIDs?yes tylenol when he is sore  Any readings above 180/120? No If yes any symptoms of hypertensive emergency? patient denies any symptoms of high blood pressure   What recent interventions/DTPs have been  made by any provider to improve Blood Pressure control since last CPP Visit: none   Any recent hospitalizations or ED visits since last visit with CPP? No  What diet changes have been made to improve Blood Pressure Control?  Patient states that he tries to maintain a healthy diet  What exercise is being done to improve your Blood Pressure Control?  Patient states that he does walk 1 hour daily  Adherence Review: Is the patient currently on ACE/ARB medication? Yes Does the patient have >5 day gap between last estimated fill dates? No   Star Rating Drugs:  Medication:  Last Fill: Day Supply Benazepril 40 mg  06/04/21  90 Metformin  500 mg 08/04/21 90  Care Gaps: Annual wellness visit in last year? No Most Recent BP reading:140/79 11/28/20  If Diabetic: Most recent A1C reading: Last eye exam / retinopathy screening: Last diabetic foot exam:   PCP appointment on 11/29/21    [place] Pharmacist Assistant [phone]   Time spent; 41

## 2021-09-13 ENCOUNTER — Other Ambulatory Visit: Payer: Self-pay

## 2021-09-13 ENCOUNTER — Ambulatory Visit (INDEPENDENT_AMBULATORY_CARE_PROVIDER_SITE_OTHER): Payer: Medicare Other | Admitting: Internal Medicine

## 2021-09-13 ENCOUNTER — Encounter: Payer: Self-pay | Admitting: Internal Medicine

## 2021-09-13 VITALS — BP 136/88 | HR 77 | Temp 98.2°F | Ht 70.0 in | Wt 160.0 lb

## 2021-09-13 DIAGNOSIS — H938X2 Other specified disorders of left ear: Secondary | ICD-10-CM | POA: Diagnosis not present

## 2021-09-13 DIAGNOSIS — H9220 Otorrhagia, unspecified ear: Secondary | ICD-10-CM | POA: Diagnosis not present

## 2021-09-13 DIAGNOSIS — H6122 Impacted cerumen, left ear: Secondary | ICD-10-CM

## 2021-09-13 DIAGNOSIS — H9192 Unspecified hearing loss, left ear: Secondary | ICD-10-CM

## 2021-09-13 NOTE — Patient Instructions (Addendum)
   Your ear was cleaned out today.  If you see a little blood from the ear this is not concerning.   Do not use Q-tips.    Call with any concerns.

## 2021-09-13 NOTE — Progress Notes (Signed)
Subjective:    Patient ID: Christopher Bradshaw, male    DOB: 13-Oct-1946, 75 y.o.   MRN: [id number]  This visit occurred during the SARS-CoV-2 public health emergency.  Safety protocols were in place, including screening questions prior to the visit, additional usage of staff PPE, and extensive cleaning of exam room while observing appropriate contact time as indicated for disinfecting solutions.    HPI The patient is here for an acute visit.   Yesterday morning and felt good when he woke up.  Soon after waking up he felt like his left ear was stopped up and something was in it.  It sounded like fluid.  He uses qtips on occasion, but not consistently.  He used a Q-tip and it felt like he was sticking it into jello.  When he brought it back out it was covered in dried blood and wax.  Ear still felt stopped up.  He did it two more times.  Each time he did it there was less and less blood/wax.  He has decreased hearing in the left ear.  He has never had pain.   He denies any issues with wax or needing to have his ears cleaned out.  He denies any right ear issues.  He denies other symptoms.  Medications and allergies reviewed with patient and updated if appropriate.  Patient Active Problem List   Diagnosis Date Noted   Routine general medical examination at a health care facility 10/23/2015   TIA (transient ischemic attack) 05/31/2014   Seasonal allergic rhinitis 04/27/2010   NEPHROLITHIASIS, HX OF 04/27/2010   DM (diabetes mellitus), type 2 with peripheral vascular complications ([place]) 28/31/5176   Hyperlipidemia associated with type 2 diabetes mellitus ([place]) 02/13/2008   Benign essential HTN 02/13/2008   RIGHT BUNDLE BRANCH BLOCK 02/13/2008   BPH without obstruction/lower urinary tract symptoms 02/13/2008    Current Outpatient Medications on File Prior to Visit  Medication Sig Dispense Refill   acetaminophen (TYLENOL) 325 MG tablet Take 650 mg by mouth every 6 (six) hours as needed.      aspirin 81 MG tablet Take 81 mg by mouth 2 (two) times daily.     benazepril (LOTENSIN) 40 MG tablet Take 1 tablet (40 mg total) by mouth daily. 90 tablet 3   glucose blood (FREESTYLE LITE) test strip USE TO TEST BLOOD SUGAR ONCE DAILY ICD E11 59 100 each 11   ibuprofen (ADVIL) 200 MG tablet Take 200 mg by mouth every 6 (six) hours as needed.     Lancets (FREESTYLE) lancets USE TO CHECK BLOOD SUGAR ONCE DAILY ICD E11 59 100 each 11   metFORMIN (GLUCOPHAGE) 500 MG tablet TAKE 1 TABLET BY MOUTH TWICE DAILY WITH A MEAL 180 tablet 3   Omega-3 Fatty Acids (FISH OIL) 1000 MG CAPS Take by mouth.     simvastatin (ZOCOR) 40 MG tablet Take 1 tablet (40 mg total) by mouth at bedtime. 90 tablet 3   vitamin E 100 UNIT capsule Take 400 Units by mouth daily.     No current facility-administered medications on file prior to visit.    Past Medical History:  Diagnosis Date   Allergic rhinitis    BPH (benign prostatic hypertrophy)    Dr Jeffie Pollock   Diabetes mellitus without complication ([place])    resolved with TLC   Femoral bruit    Rosanna Randy syndrome    total bilirubin 1.5 in 2006   Hyperglycemia    Hyperlipidemia    Hypertension  Nephrolithiasis    calcium oxalate stone   RBBB (right bundle branch block)    Thrombophlebitis 09/2004   TIA (transient ischemic attack) 2015   Varicose vein    right lower extremity    Past Surgical History:  Procedure Laterality Date   cataract surgery Bilateral 03/05/2018   COLONOSCOPY  2000, [id number]   negative X 3;Dr  Delfin Edis   CYSTOSCOPY     for hematuria,urethritis 2004, Dr Serita Butcher   lithrotripsy     for kidney stones   NASAL SEPTUM SURGERY     deviation correction   Renal calculi lithotripsy     x 1,residual L renal calculus, spontaneous passage 10-12 x   TONSILLECTOMY      Social History   Socioeconomic History   Marital status: Married    Spouse name: Not on file   Number of children: 2   Years of education: Not on file   Highest  education level: Not on file  Occupational History   Occupation: Data processing manager, part time   Tobacco Use   Smoking status: Never   Smokeless tobacco: Never  Vaping Use   Vaping Use: Never used  Substance and Sexual Activity   Alcohol use: Yes    Comment:  very rarely   Drug use: No   Sexual activity: Not Currently  Other Topics Concern   Not on file  Social History Narrative   Regular exercise- yes walks once daily 60 min w/o symtoms          Social Determinants of Health   Financial Resource Strain: Low Risk    Difficulty of Paying Living Expenses: Not hard at all  Food Insecurity: No Food Insecurity   Worried About Charity fundraiser in the Last Year: Never true   [place] in the Last Year: Never true  Transportation Needs: No Transportation Needs   Lack of Transportation (Medical): No   Lack of Transportation (Non-Medical): No  Physical Activity: Sufficiently Active   Days of Exercise per Week: 7 days   Minutes of Exercise per Session: 60 min  Stress: No Stress Concern Present   Feeling of Stress : Not at all  Social Connections: Socially Integrated   Frequency of Communication with Friends and Family: More than three times a week   Frequency of Social Gatherings with Friends and Family: Once a week   Attends Religious Services: More than 4 times per year   Active Member of [employer] or Organizations: No   Attends Music therapist: More than 4 times per year   Marital Status: Married    Family History  Problem Relation Age of Onset   Lung cancer Father        smoker   Alzheimer's disease Father    Colon cancer Paternal Aunt    Ovarian cancer Paternal Aunt    Emphysema Paternal Grandfather    Colon cancer Paternal Grandfather    Diabetes Neg Hx    Heart disease Neg Hx    Stroke Neg Hx     Review of Systems  Constitutional:  Negative for chills and fever.  HENT:  Positive for hearing loss. Negative for congestion, ear discharge, ear  pain, sinus pain, sore throat and tinnitus.   Neurological:  Negative for dizziness and headaches.      Objective:   Vitals:   09/13/21 1540  BP: 136/88  Pulse: 77  Temp: 98.2 F (36.8 C)  SpO2: 99%   BP Readings from Last  3 Encounters:  09/13/21 136/88  11/28/20 140/76  11/24/20 (!) 160/80   Wt Readings from Last 3 Encounters:  09/13/21 160 lb (72.6 kg)  11/28/20 165 lb (74.8 kg)  11/24/20 163 lb 6.4 oz (74.1 kg)   Body mass index is 22.96 kg/m.   Physical Exam    PRE-PROCEDURE EXAM: Left TM cannot be visualized due to total occlusion/impaction of the ear canal with areas of dark and light-colored wax.  Ear canal normal without blood, discharge or skin tears. PROCEDURE INDICATION: remove wax to visualize ear drum & improve hearing, relieve discomfort CONSENT:  Verbal  PROCEDURE NOTE:   LEFT EAR:  The CMA used a metal wax curette under direct vision with an otoscope to free the wax bolus from the ear wall and then successfully removed a small bit of wax. The ear was then irrigated with warm water to remove the remaining wax. POST- PROCEDURE EXAM: TMs successfully visualized and found to have no erythema.  Ear canal was mildly erythematous there is a small skin tear superior mid ear canal with blood clot present, scant blood surrounding-unsure if that was present and covered with earwax previous to the ear cleaning for a result of the ear cleaning.     After the procedure he denied any ear pain or ear canal pain.  His hearing had improved    Assessment & Plan:    Left ear with decreased hearing, clogged sensation and impacted cerumen, mild hemorrhage of the ear canal:  Acute Presented with clogged ear sensation and decreased hearing left ear Found to be impacted with cerumen and ear lavage for is performed successfully TM visualized and normal Hearing improved Mid ear canal and superior surface there is a very small skin tear/hemorrhage with blood clot formed  minimal blood surrounding-difficult to tell if this was covered by wax when I initially looked in the ear or if it is a result of the ear lavage-it is mild and not currently bleeding Advised he may see some dried blood or a little bit of blood in the next day, but nothing to worry about He will call with questions or concerns

## 2021-10-03 DIAGNOSIS — Z23 Encounter for immunization: Secondary | ICD-10-CM | POA: Diagnosis not present

## 2021-10-31 ENCOUNTER — Other Ambulatory Visit: Payer: Self-pay | Admitting: Internal Medicine

## 2021-10-31 DIAGNOSIS — I10 Essential (primary) hypertension: Secondary | ICD-10-CM

## 2021-11-27 ENCOUNTER — Other Ambulatory Visit: Payer: Self-pay

## 2021-11-27 ENCOUNTER — Ambulatory Visit: Payer: Medicare Other

## 2021-11-29 ENCOUNTER — Telehealth (INDEPENDENT_AMBULATORY_CARE_PROVIDER_SITE_OTHER): Payer: Medicare Other | Admitting: Internal Medicine

## 2021-11-29 ENCOUNTER — Encounter: Payer: Self-pay | Admitting: Internal Medicine

## 2021-11-29 DIAGNOSIS — U071 COVID-19: Secondary | ICD-10-CM | POA: Insufficient documentation

## 2021-11-29 DIAGNOSIS — E1169 Type 2 diabetes mellitus with other specified complication: Secondary | ICD-10-CM

## 2021-11-29 DIAGNOSIS — I10 Essential (primary) hypertension: Secondary | ICD-10-CM | POA: Diagnosis not present

## 2021-11-29 DIAGNOSIS — E1151 Type 2 diabetes mellitus with diabetic peripheral angiopathy without gangrene: Secondary | ICD-10-CM | POA: Diagnosis not present

## 2021-11-29 DIAGNOSIS — E785 Hyperlipidemia, unspecified: Secondary | ICD-10-CM | POA: Diagnosis not present

## 2021-11-29 MED ORDER — OLOPATADINE HCL 0.2 % OP SOLN: 2.0000 [drp] | Freq: Every day | OPHTHALMIC | 0 refills | Status: AC | PRN

## 2021-11-29 MED ORDER — METFORMIN HCL 500 MG PO TABS: ORAL_TABLET | ORAL | 0 refills | Status: DC

## 2021-11-29 MED ORDER — FREESTYLE LITE TEST VI STRP: ORAL_STRIP | 11 refills | Status: AC

## 2021-11-29 MED ORDER — FREESTYLE LANCETS MISC: 11 refills | Status: AC

## 2021-11-29 MED ORDER — BENAZEPRIL HCL 40 MG PO TABS
40.0000 mg | ORAL_TABLET | Freq: Every day | ORAL | 0 refills | Status: DC
Start: 2021-11-29 — End: 2022-05-29

## 2021-11-29 MED ORDER — SIMVASTATIN 40 MG PO TABS
40.0000 mg | ORAL_TABLET | Freq: Every day | ORAL | 0 refills | Status: DC
Start: 2021-11-29 — End: 2022-05-09

## 2021-11-29 NOTE — Assessment & Plan Note (Signed)
Within window for antiviral but we decide he does not need this. Symptoms are mild and improving. Rx pataday eye drops he has eye irritation likely from excessive coughing initially.

## 2021-11-29 NOTE — Assessment & Plan Note (Signed)
Needs refills until he can come back for visit and labs. Done today.

## 2021-11-29 NOTE — Progress Notes (Signed)
Virtual Visit via Audio Note  I connected with Christopher Bradshaw on 11/29/21 at 10:40 AM EST by an audio-only enabled telemedicine application and verified that I am speaking with the correct person using two identifiers.  The patient and the provider were at separate locations throughout the entire encounter. Patient location: car, Provider location: work   I discussed the limitations of evaluation and management by telemedicine and the availability of in person appointments. The patient expressed understanding and agreed to proceed. The patient and the provider were the only parties present for the visit unless noted in HPI below.  History of Present Illness: The patient is a 75 y.o. man with visit for covid-19 positive Sunday 11/26/21.   Observations/Objective: A and O times 3, no cough during visit  Assessment and Plan: See problem oriented charting  Follow Up Instructions: rx pataday eye drops  Visit time 12 minutes in non-face to face communication with patient and coordination of care.  I discussed the assessment and treatment plan with the patient. The patient was provided an opportunity to ask questions and all were answered. The patient agreed with the plan and demonstrated an understanding of the instructions.   The patient was advised to call back or seek an in-person evaluation if the symptoms worsen or if the condition fails to improve as anticipated.  Hoyt Koch, MD

## 2021-11-29 NOTE — Assessment & Plan Note (Signed)
Needs refill until he can come back for labs/visit. Done today.

## 2021-12-27 ENCOUNTER — Telehealth: Payer: Self-pay | Admitting: Internal Medicine

## 2021-12-27 NOTE — Telephone Encounter (Signed)
LVM for pt to rtn my call to schedule AWV with [facility].  

## 2022-01-24 ENCOUNTER — Ambulatory Visit: Payer: Medicare Other

## 2022-02-06 ENCOUNTER — Telehealth: Payer: Medicare Other

## 2022-02-06 ENCOUNTER — Ambulatory Visit (INDEPENDENT_AMBULATORY_CARE_PROVIDER_SITE_OTHER): Payer: Medicare Other

## 2022-02-06 ENCOUNTER — Other Ambulatory Visit: Payer: Self-pay

## 2022-02-06 ENCOUNTER — Ambulatory Visit: Payer: Medicare Other

## 2022-02-06 VITALS — BP 101/66 | HR 84

## 2022-02-06 DIAGNOSIS — Z0000 Encounter for general adult medical examination without abnormal findings: Secondary | ICD-10-CM | POA: Diagnosis not present

## 2022-02-06 NOTE — Patient Instructions (Signed)
Mr. Christopher Bradshaw , Thank you for taking time to come for your Medicare Wellness Visit. I appreciate your ongoing commitment to your health goals. Please review the following plan we discussed and let me know if I can assist you in the future.   Screening recommendations/referrals: Colonoscopy: Not a candidate for screening due to age Recommended yearly ophthalmology/optometry visit for glaucoma screening and checkup Recommended yearly dental visit for hygiene and checkup  Vaccinations: Influenza vaccine: 10/03/2021 Pneumococcal vaccine: 07/20/2011, 10/20/2015 Tdap vaccine: 06/03/2019; due every 10 years Shingles vaccine: never done/no record   Covid-19: 12/22/2019, 01/13/2020, 09/23/2020  Advanced directives: Advance directive discussed with you today. Even though you declined this today please call our office should you change your mind and we can give you the proper paperwork for you to fill out.  Conditions/risks identified: Reviewed health maintenance screenings with patient today and relevant education, vaccines, and/or referrals were provided.    Continue to eat heart healthy diet (full of fruits, vegetables, whole grains, lean protein, water--limit salt, fat, and sugar intake) and increase physical activity as tolerated.   Continue doing brain stimulating activities (puzzles, reading, adult coloring books, staying active) to keep memory sharp.   Next appointment: You are scheduled for 02/07/2023 at 3:00 p.m. telephone visit with Mignon Pine, Nurse Health Advisor.  If you need to reschedule or cancel please call [phone].  Preventive Care 78 Years and Older, Male Preventive care refers to lifestyle choices and visits with your health care provider that can promote health and wellness. What does preventive care include? A yearly physical exam. This is also called an annual well check. Dental exams once or twice a year. Routine eye exams. Ask your health care provider how often you should have your  eyes checked. Personal lifestyle choices, including: Daily care of your teeth and gums. Regular physical activity. Eating a healthy diet. Avoiding tobacco and drug use. Limiting alcohol use. Practicing safe sex. Taking low doses of aspirin every day. Taking vitamin and mineral supplements as recommended by your health care provider. What happens during an annual well check? The services and screenings done by your health care provider during your annual well check will depend on your age, overall health, lifestyle risk factors, and family history of disease. Counseling  Your health care provider may ask you questions about your: Alcohol use. Tobacco use. Drug use. Emotional well-being. Home and relationship well-being. Sexual activity. Eating habits. History of falls. Memory and ability to understand (cognition). Work and work Statistician. Screening  You may have the following tests or measurements: Height, weight, and BMI. Blood pressure. Lipid and cholesterol levels. These may be checked every 5 years, or more frequently if you are over 63 years old. Skin check. Lung cancer screening. You may have this screening every year starting at age 27 if you have a 30-pack-year history of smoking and currently smoke or have quit within the past 15 years. Fecal occult blood test (FOBT) of the stool. You may have this test every year starting at age 64. Flexible sigmoidoscopy or colonoscopy. You may have a sigmoidoscopy every 5 years or a colonoscopy every 10 years starting at age 70. Prostate cancer screening. Recommendations will vary depending on your family history and other risks. Hepatitis C blood test. Hepatitis B blood test. Sexually transmitted disease (STD) testing. Diabetes screening. This is done by checking your blood sugar (glucose) after you have not eaten for a while (fasting). You may have this done every 1-3 years. Abdominal aortic aneurysm (AAA) screening. You  may need  this if you are a current or former smoker. Osteoporosis. You may be screened starting at age 57 if you are at high risk. Talk with your health care provider about your test results, treatment options, and if necessary, the need for more tests. Vaccines  Your health care provider may recommend certain vaccines, such as: Influenza vaccine. This is recommended every year. Tetanus, diphtheria, and acellular pertussis (Tdap, Td) vaccine. You may need a Td booster every 10 years. Zoster vaccine. You may need this after age 48. Pneumococcal 13-valent conjugate (PCV13) vaccine. One dose is recommended after age 24. Pneumococcal polysaccharide (PPSV23) vaccine. One dose is recommended after age 50. Talk to your health care provider about which screenings and vaccines you need and how often you need them. This information is not intended to replace advice given to you by your health care provider. Make sure you discuss any questions you have with your health care provider. Document Released: 12/30/2015 Document Revised: 08/22/2016 Document Reviewed: 10/04/2015 Elsevier Interactive Patient Education  2017 [place] Prevention in the Home Falls can cause injuries. They can happen to people of all ages. There are many things you can do to make your home safe and to help prevent falls. What can I do on the outside of my home? Regularly fix the edges of walkways and driveways and fix any cracks. Remove anything that might make you trip as you walk through a door, such as a raised step or threshold. Trim any bushes or trees on the path to your home. Use bright outdoor lighting. Clear any walking paths of anything that might make someone trip, such as rocks or tools. Regularly check to see if handrails are loose or broken. Make sure that both sides of any steps have handrails. Any raised decks and porches should have guardrails on the edges. Have any leaves, snow, or ice cleared regularly. Use  sand or salt on walking paths during winter. Clean up any spills in your garage right away. This includes oil or grease spills. What can I do in the bathroom? Use night lights. Install grab bars by the toilet and in the tub and shower. Do not use towel bars as grab bars. Use non-skid mats or decals in the tub or shower. If you need to sit down in the shower, use a plastic, non-slip stool. Keep the floor dry. Clean up any water that spills on the floor as soon as it happens. Remove soap buildup in the tub or shower regularly. Attach bath mats securely with double-sided non-slip rug tape. Do not have throw rugs and other things on the floor that can make you trip. What can I do in the bedroom? Use night lights. Make sure that you have a light by your bed that is easy to reach. Do not use any sheets or blankets that are too big for your bed. They should not hang down onto the floor. Have a firm chair that has side arms. You can use this for support while you get dressed. Do not have throw rugs and other things on the floor that can make you trip. What can I do in the kitchen? Clean up any spills right away. Avoid walking on wet floors. Keep items that you use a lot in easy-to-reach places. If you need to reach something above you, use a strong step stool that has a grab bar. Keep electrical cords out of the way. Do not use floor polish or wax that  makes floors slippery. If you must use wax, use non-skid floor wax. Do not have throw rugs and other things on the floor that can make you trip. What can I do with my stairs? Do not leave any items on the stairs. Make sure that there are handrails on both sides of the stairs and use them. Fix handrails that are broken or loose. Make sure that handrails are as long as the stairways. Check any carpeting to make sure that it is firmly attached to the stairs. Fix any carpet that is loose or worn. Avoid having throw rugs at the top or bottom of the  stairs. If you do have throw rugs, attach them to the floor with carpet tape. Make sure that you have a light switch at the top of the stairs and the bottom of the stairs. If you do not have them, ask someone to add them for you. What else can I do to help prevent falls? Wear shoes that: Do not have high heels. Have rubber bottoms. Are comfortable and fit you well. Are closed at the toe. Do not wear sandals. If you use a stepladder: Make sure that it is fully opened. Do not climb a closed stepladder. Make sure that both sides of the stepladder are locked into place. Ask someone to hold it for you, if possible. Clearly mark and make sure that you can see: Any grab bars or handrails. First and last steps. Where the edge of each step is. Use tools that help you move around (mobility aids) if they are needed. These include: Canes. Walkers. Scooters. Crutches. Turn on the lights when you go into a dark area. Replace any light bulbs as soon as they burn out. Set up your furniture so you have a clear path. Avoid moving your furniture around. If any of your floors are uneven, fix them. If there are any pets around you, be aware of where they are. Review your medicines with your doctor. Some medicines can make you feel dizzy. This can increase your chance of falling. Ask your doctor what other things that you can do to help prevent falls. This information is not intended to replace advice given to you by your health care provider. Make sure you discuss any questions you have with your health care provider. Document Released: 09/29/2009 Document Revised: 05/10/2016 Document Reviewed: 01/07/2015 Elsevier Interactive Patient Education  2017 [employer].

## 2022-02-06 NOTE — Progress Notes (Addendum)
I connected with Christopher Bradshaw today by telephone and verified that I am speaking with the correct person using two identifiers. Location patient: home Location provider: work Persons participating in the virtual visit: patient, provider.   I discussed the limitations, risks, security and privacy concerns of performing an evaluation and management service by telephone and the availability of in person appointments. I also discussed with the patient that there may be a patient responsible charge related to this service. The patient expressed understanding and verbally consented to this telephonic visit.    Interactive audio and video telecommunications were attempted between this provider and patient, however failed, due to patient having technical difficulties OR patient did not have access to video capability.  We continued and completed visit with audio only.  Some vital signs may be absent or patient reported.   Time Spent with patient on telephone encounter: 40 minutes  Subjective:   Christopher Bradshaw is a 76 y.o. male who presents for Medicare Annual/Subsequent preventive examination.  Review of Systems     Cardiac Risk Factors include: advanced age (>38men, >88 women);diabetes mellitus;dyslipidemia;family history of premature cardiovascular disease;hypertension;male gender     Objective:    There were no vitals filed for this visit. There is no height or weight on file to calculate BMI.  Advanced Directives 02/06/2022 11/24/2020 11/24/2019 11/12/2018 11/06/2017  Does Patient Have a Medical Advance Directive? No No No No No  Would patient like information on creating a medical advance directive? No - Patient declined No - Patient declined No - Patient declined No - Patient declined Yes (ED - Information included in AVS)    Current Medications (verified) Outpatient Encounter Medications as of 02/06/2022  Medication Sig   acetaminophen (TYLENOL) 325 MG tablet Take 650 mg by mouth every 6  (six) hours as needed.   aspirin 81 MG tablet Take 81 mg by mouth 2 (two) times daily.   benazepril (LOTENSIN) 40 MG tablet Take 1 tablet (40 mg total) by mouth daily.   glucose blood (FREESTYLE LITE) test strip USE TO TEST BLOOD SUGAR ONCE DAILY ICD E11 59   ibuprofen (ADVIL) 200 MG tablet Take 200 mg by mouth every 6 (six) hours as needed.   Lancets (FREESTYLE) lancets USE TO CHECK BLOOD SUGAR ONCE DAILY ICD E11 59   metFORMIN (GLUCOPHAGE) 500 MG tablet TAKE 1 TABLET BY MOUTH TWICE DAILY WITH A MEAL   Olopatadine HCl 0.2 % SOLN Apply 2 drops to eye daily as needed.   Omega-3 Fatty Acids (FISH OIL) 1000 MG CAPS Take by mouth.   simvastatin (ZOCOR) 40 MG tablet Take 1 tablet (40 mg total) by mouth at bedtime.   vitamin E 100 UNIT capsule Take 400 Units by mouth daily.   No facility-administered encounter medications on file as of 02/06/2022.    Allergies (verified) Patient has no known allergies.   History: Past Medical History:  Diagnosis Date   Allergic rhinitis    BPH (benign prostatic hypertrophy)    Dr Jeffie Pollock   Diabetes mellitus without complication ([place])    resolved with TLC   Femoral bruit    Rosanna Randy syndrome    total bilirubin 1.5 in 2006   Hyperglycemia    Hyperlipidemia    Hypertension    Nephrolithiasis    calcium oxalate stone   RBBB (right bundle branch block)    Thrombophlebitis 09/2004   TIA (transient ischemic attack) 2015   Varicose vein    right lower extremity   Past Surgical History:  Procedure Laterality Date   cataract surgery Bilateral 03/05/2018   COLONOSCOPY  2000, 2007,2014   negative X 3;Dr  Delfin Edis   CYSTOSCOPY     for hematuria,urethritis 2004, Dr Serita Butcher   lithrotripsy     for kidney stones   NASAL SEPTUM SURGERY     deviation correction   Renal calculi lithotripsy     x 1,residual L renal calculus, spontaneous passage 10-12 x   TONSILLECTOMY     Family History  Problem Relation Age of Onset   Lung cancer Father        smoker    Alzheimer's disease Father    Colon cancer Paternal Aunt    Ovarian cancer Paternal Aunt    Emphysema Paternal Grandfather    Colon cancer Paternal Grandfather    Diabetes Neg Hx    Heart disease Neg Hx    Stroke Neg Hx    Social History   Socioeconomic History   Marital status: Married    Spouse name: Not on file   Number of children: 2   Years of education: Not on file   Highest education level: Not on file  Occupational History   Occupation: Data processing manager, part time   Tobacco Use   Smoking status: Never   Smokeless tobacco: Never  Vaping Use   Vaping Use: Never used  Substance and Sexual Activity   Alcohol use: Yes    Comment:  very rarely   Drug use: No   Sexual activity: Not Currently  Other Topics Concern   Not on file  Social History Narrative   Regular exercise- yes walks once daily 60 min w/o symtoms          Social Determinants of Health   Financial Resource Strain: Low Risk    Difficulty of Paying Living Expenses: Not hard at all  Food Insecurity: No Food Insecurity   Worried About Charity fundraiser in the Last Year: Never true   [place] in the Last Year: Never true  Transportation Needs: No Transportation Needs   Lack of Transportation (Medical): No   Lack of Transportation (Non-Medical): No  Physical Activity: Sufficiently Active   Days of Exercise per Week: 7 days   Minutes of Exercise per Session: 60 min  Stress: No Stress Concern Present   Feeling of Stress : Not at all  Social Connections: Socially Integrated   Frequency of Communication with Friends and Family: More than three times a week   Frequency of Social Gatherings with Friends and Family: More than three times a week   Attends Religious Services: More than 4 times per year   Active Member of [employer] or Organizations: No   Attends Music therapist: More than 4 times per year   Marital Status: Married    Tobacco Counseling Counseling given: Not  Answered   Clinical Intake:  Pre-visit preparation completed: Yes  Pain : No/denies pain     Nutritional Risks: None Diabetes: Yes CBG done?: No Did pt. bring in CBG monitor from home?: No  How often do you need to have someone help you when you read instructions, pamphlets, or other written materials from your doctor or pharmacy?: 1 - Never What is the last grade level you completed in school?: High School Graduate; works PT at [employer]  Diabetic? yes  Interpreter Needed?: No  Information entered by :: Lisette Abu, LPN   Activities of Daily Living In your present state of health, do you  have any difficulty performing the following activities: 02/06/2022  Hearing? N  Vision? N  Difficulty concentrating or making decisions? N  Walking or climbing stairs? N  Dressing or bathing? N  Doing errands, shopping? N  Preparing Food and eating ? N  Using the Toilet? N  In the past six months, have you accidently leaked urine? N  Do you have problems with loss of bowel control? N  Managing your Medications? N  Managing your Finances? N  Housekeeping or managing your Housekeeping? N  Some recent data might be hidden    Patient Care Team: Hoyt Koch, MD as PCP - General (Internal Medicine) Irine Seal, MD as Attending Physician (Urology) Rutherford Guys, MD as Consulting Physician (Ophthalmology) Charlton Haws, [facility] as Pharmacist (Pharmacist)  Indicate any recent Medical Services you may have received from other than [facility] providers in the past year (date may be approximate).     Assessment:   This is a routine wellness examination for [place].  Hearing/Vision screen Hearing Screening - Comments:: Patient denied any hearing difficulty.   No hearing aids.  Vision Screening - Comments:: Patient wears readers for fine print.  Eye exam done annually by: Dr. Rutherford Guys  Dietary issues and exercise activities discussed: Current Exercise Habits: Home  exercise routine, Type of exercise: walking, Time (Minutes): 60, Frequency (Times/Week): 7, Weekly Exercise (Minutes/Week): 420, Intensity: Moderate, Exercise limited by: cardiac condition(s)   Goals Addressed               This Visit's Progress     Diabetes Patient stated goal (pt-stated)        My goal is to continue to walk 1 hour everyday and get better control of my glucose.      Depression Screen PHQ 2/9 Scores 02/06/2022 11/24/2020 11/24/2019 11/12/2018 11/06/2017 11/06/2017 04/03/2016  PHQ - 2 Score 0 0 0 0 0 0 0  PHQ- 9 Score - - - - 0 - -    Fall Risk Fall Risk  02/06/2022 11/24/2020 11/24/2019 11/12/2018 11/03/2018  Falls in the past year? 0 1 1 0 0  Comment - - - - Emmi Telephone Survey: data to providers prior to load  Number falls in past yr: 0 0 0 - -  Injury with Fall? 0 1 1 - -  Comment - fractured tailbox - - -  Risk for fall due to : No Fall Risks - - - -  Follow up Falls evaluation completed Falls evaluation completed Falls prevention discussed - -    FALL RISK PREVENTION PERTAINING TO THE HOME:  Any stairs in or around the home? Yes  If so, are there any without handrails? No  Home free of loose throw rugs in walkways, pet beds, electrical cords, etc? Yes  Adequate lighting in your home to reduce risk of falls? Yes   ASSISTIVE DEVICES UTILIZED TO PREVENT FALLS:  Life alert? No  Use of a cane, walker or w/c? No  Grab bars in the bathroom? Yes  Shower chair or bench in shower? Yes  Elevated toilet seat or a handicapped toilet? Yes   TIMED UP AND GO:  Was the test performed? No .  Length of time to ambulate 10 feet: n/a sec.   Gait steady and fast without use of assistive device  Cognitive Function: Normal cognitive status assessed by direct observation by this Nurse Health Advisor. No abnormalities found.   MMSE - Mini Mental State Exam 11/12/2018  Orientation to time 5  Orientation  to Place 5  Registration 3  Attention/ Calculation 4  Recall 2   Language- name 2 objects 2  Language- repeat 1  Language- follow 3 step command 3  Language- read & follow direction 1  Write a sentence 1  Copy design 1  Total score 28        Immunizations Immunization History  Administered Date(s) Administered   Fluad Quad(high Dose 65+) 10/12/2019   Influenza Split 09/18/2012   Influenza, High Dose Seasonal PF 09/29/2013, 08/31/2016, 10/07/2018   Influenza,inj,Quad PF,6+ Mos 10/08/2014, 10/21/2015   Influenza-Unspecified 09/25/2017, 09/19/2020   PFIZER(Purple Top)SARS-COV-2 Vaccination 12/22/2019, 01/13/2020, 09/23/2020   Pneumococcal Conjugate-13 10/20/2015   Pneumococcal Polysaccharide-23 07/20/2011   Td 01/25/2009   Tdap 06/03/2019    TDAP status: Up to date  Flu Vaccine status: Up to date  Pneumococcal vaccine status: Up to date  Covid-19 vaccine status: Completed vaccines  Qualifies for Shingles Vaccine? Yes   Zostavax completed No   Shingrix Completed?: No.    Education has been provided regarding the importance of this vaccine. Patient has been advised to call insurance company to determine out of pocket expense if they have not yet received this vaccine. Advised may also receive vaccine at local pharmacy or Health Dept. Verbalized acceptance and understanding.  Screening Tests Health Maintenance  Topic Date Due   Hepatitis C Screening  Never done   Zoster Vaccines- Shingrix (1 of 2) Never done   COVID-19 Vaccine (4 - Booster for Pfizer series) 11/18/2020   HEMOGLOBIN A1C  05/29/2021   FOOT EXAM  11/28/2021   OPHTHALMOLOGY EXAM  08/03/2022   TETANUS/TDAP  06/02/2029   Pneumonia Vaccine 76+ Years old  Completed   INFLUENZA VACCINE  Completed   HPV VACCINES  Aged Out   COLONOSCOPY (Pts 45-61yrs Insurance coverage will need to be confirmed)  Discontinued    Health Maintenance  Health Maintenance Due  Topic Date Due   Hepatitis C Screening  Never done   Zoster Vaccines- Shingrix (1 of 2) Never done   COVID-19  Vaccine (4 - Booster for Pfizer series) 11/18/2020   HEMOGLOBIN A1C  05/29/2021   FOOT EXAM  11/28/2021    Colorectal cancer screening: No longer required.   Lung Cancer Screening: (Low Dose CT Chest recommended if Age 25-80 years, 30 pack-year currently smoking OR have quit w/in 15years.) does not qualify.   Lung Cancer Screening Referral: no  Additional Screening:  Hepatitis C Screening: does not qualify; Completed no  Vision Screening: Recommended annual ophthalmology exams for early detection of glaucoma and other disorders of the eye. Is the patient up to date with their annual eye exam?  Yes  Who is the provider or what is the name of the office in which the patient attends annual eye exams? Rutherford Guys, MD. If pt is not established with a provider, would they like to be referred to a provider to establish care? No .   Dental Screening: Recommended annual dental exams for proper oral hygiene  Community Resource Referral / Chronic Care Management: CRR required this visit?  No   CCM required this visit?  No      Plan:     I have personally reviewed and noted the following in the patients chart:   Medical and social history Use of alcohol, tobacco or illicit drugs  Current medications and supplements including opioid prescriptions. Patient is not currently taking opioid prescriptions. Functional ability and status Nutritional status Physical activity Advanced directives List of other physicians  Hospitalizations, surgeries, and ER visits in previous 12 months Vitals Screenings to include cognitive, depression, and falls Referrals and appointments  In addition, I have reviewed and discussed with patient certain preventive protocols, quality metrics, and best practice recommendations. A written personalized care plan for preventive services as well as general preventive health recommendations were provided to patient.     Sheral Flow, LPN   01/10/2711    Nurse Notes:  Patient is cogitatively intact. Patient provided blood pressure, pulse and fasting glucose  There is no height or weight on file to calculate BMI. Patient stated that he has no issues with gait or balance; does not use any assistive devices. Medications reviewed with patient; no opioid use noted.

## 2022-03-19 ENCOUNTER — Encounter: Payer: Medicare Other | Admitting: Internal Medicine

## 2022-04-11 ENCOUNTER — Ambulatory Visit: Payer: Medicare Other

## 2022-04-11 DIAGNOSIS — E1151 Type 2 diabetes mellitus with diabetic peripheral angiopathy without gangrene: Secondary | ICD-10-CM

## 2022-04-11 DIAGNOSIS — I10 Essential (primary) hypertension: Secondary | ICD-10-CM

## 2022-04-11 DIAGNOSIS — E1169 Type 2 diabetes mellitus with other specified complication: Secondary | ICD-10-CM

## 2022-04-11 NOTE — Progress Notes (Signed)
? ?Chronic Care Management ?Pharmacy Note ? ?04/11/2022 ?Name:  Christopher Bradshaw MRN:  [id number] DOB:  1946/06/01 ? ?Summary: ?-Patient endorses compliance to current medications, denies any issues or concerns with his medications at this time  ?-Not currently checking BG at home, notes that he has increased activity levels since most recent appointment - is using push mower to cut grass on his 1 acre land  ?-BP well controlled at home averaging 120/60's - denies any issues with hypotension  ? ?Recommendations/Changes made from today's visit: ?-recommending no changes to medications at this time  ?-Encouraged patient to continue with increase in activity, continue checking BP at least once weekly, encouraged to restart checking blood sugars 1-2 times a week ?-Advised for patient to schedule PCP visit as he is overdue for appointment - will call office to schedule, did not wish to schedule at this time  ? ?Plan: ?-F/u in 1 year  ? ? ?Subjective: ?Christopher Bradshaw is an 76 y.o. year old male who is a primary patient of Hoyt Koch, MD.  The CCM team was consulted for assistance with disease management and care coordination needs.   ? ?Engaged with patient by telephone for follow up visit in response to provider referral for pharmacy case management and/or care coordination services.  ? ?Consent to Services:  ?The patient was given the following information about Chronic Care Management services today, agreed to services, and gave verbal consent: 1. CCM service includes personalized support from designated clinical staff supervised by the primary care provider, including individualized plan of care and coordination with other care providers 2. 24/7 contact phone numbers for assistance for urgent and routine care needs. 3. Service will only be billed when office clinical staff spend 20 minutes or more in a month to coordinate care. 4. Only one practitioner may furnish and bill the service in a calendar month. 5.The  patient may stop CCM services at any time (effective at the end of the month) by phone call to the office staff. 6. The patient will be responsible for cost sharing (co-pay) of up to 20% of the service fee (after annual deductible is met). Patient agreed to services and consent obtained. ? ?Patient Care Team: ?Hoyt Koch, MD as PCP - General (Internal Medicine) ?Irine Seal, MD as Attending Physician (Urology) ?Rutherford Guys, MD as Consulting Physician (Ophthalmology) ?Tomasa Blase, [facility] (Pharmacist) ? ?Recent office visits: ?11/29/2021 - Dr. Sharlet Salina - COVID positive - symptoms mild - declined antiviral - pataday eye drops for eye irritation  ? ?Recent consult visits: ?None ? ?Hospital visits: ?None in previous 6 months ? ?Objective: ? ?Lab Results  ?Component Value Date  ? CREATININE 0.82 11/28/2020  ? BUN 18 11/28/2020  ? GFR 85.16 11/28/2020  ? GFRNONAA 89.89 07/25/2009  ? NA 139 11/28/2020  ? K 4.7 11/28/2020  ? CALCIUM 9.7 11/28/2020  ? CO2 31 11/28/2020  ? ? ?Lab Results  ?Component Value Date/Time  ? HGBA1C 7.5 (H) 11/28/2020 02:07 PM  ? HGBA1C 7.1 (H) 12/04/2019 09:33 AM  ? GFR 85.16 11/28/2020 02:07 PM  ? GFR 94.04 12/04/2019 09:33 AM  ? MICROALBUR 0.4 10/08/2014 10:19 AM  ? MICROALBUR 0.8 09/29/2013 10:14 AM  ?  ?Last diabetic Eye exam:  ?Lab Results  ?Component Value Date/Time  ? HMDIABEYEEXA No Retinopathy 08/03/2021 10:48 AM  ?  ?Last diabetic Foot exam: No results found for: HMDIABFOOTEX  ? ?Lab Results  ?Component Value Date  ? CHOL 158 11/28/2020  ? HDL  59.70 11/28/2020  ? [place] 84 11/28/2020  ? TRIG 68.0 11/28/2020  ? CHOLHDL 3 11/28/2020  ? ? ? ?  Latest Ref Rng & Units 11/28/2020  ?  2:07 PM 12/04/2019  ?  9:33 AM 12/01/2018  ?  9:41 AM  ?Hepatic Function  ?Total Protein 6.0 - 8.3 g/dL 6.5   7.1   6.6    ?Albumin 3.5 - 5.2 g/dL 4.5   4.6   4.3    ?AST 0 - 37 U/L _0 ?ALT 0 - 53 U/L _1 ?Alk Phosphatase 39 - 117 U/L 50   129   43    ?Total Bilirubin 0.2 -  1.2 mg/dL 0.7   0.7   0.9    ? ? ?Lab Results  ?Component Value Date/Time  ? TSH 3.82 04/03/2016 08:52 AM  ? TSH 3.35 10/08/2014 10:19 AM  ? ? ? ?  Latest Ref Rng & Units 11/28/2020  ?  2:07 PM 12/04/2019  ?  9:33 AM 12/01/2018  ?  9:41 AM  ?CBC  ?WBC 4.0 - 10.5 K/uL 8.9   8.3   7.4    ?Hemoglobin 13.0 - 17.0 g/dL 14.7   15.1   15.2    ?Hematocrit 39.0 - 52.0 % 43.4   44.5   45.1    ?Platelets 150.0 - 400.0 K/uL 120.0   117.0   132.0    ? ? ?No results found for: VD25OH ? ?Clinical ASCVD: No  ?The 10-year ASCVD risk score (Arnett DK, et al., 2019) is: 37% ?  Values used to calculate the score: ?    Age: 2 years ?    Sex: Male ?    Is Non-Hispanic African American: No ?    Diabetic: Yes ?    Tobacco smoker: No ?    Systolic Blood Pressure: 518 mmHg ?    Is BP treated: Yes ?    HDL Cholesterol: 59.7 mg/dL ?    Total Cholesterol: 158 mg/dL   ? ? ?  02/06/2022  ?  3:14 PM 11/24/2020  ? 11:48 AM 11/24/2019  ? 11:38 AM  ?Depression screen PHQ 2/9  ?Decreased Interest 0 0 0  ?Down, Depressed, Hopeless 0 0 0  ?PHQ - 2 Score 0 0 0  ?  ? ?Social History  ? ?Tobacco Use  ?Smoking Status Never  ?Smokeless Tobacco Never  ? ?BP Readings from Last 3 Encounters:  ?02/06/22 101/66  ?09/13/21 136/88  ?11/28/20 140/76  ? ?Pulse Readings from Last 3 Encounters:  ?02/06/22 84  ?09/13/21 77  ?11/28/20 84  ? ?Wt Readings from Last 3 Encounters:  ?09/13/21 160 lb (72.6 kg)  ?11/28/20 165 lb (74.8 kg)  ?11/24/20 163 lb 6.4 oz (74.1 kg)  ? ? ?Assessment/Interventions: Review of patient past medical history, allergies, medications, health status, including review of consultants reports, laboratory and other test data, was performed as part of comprehensive evaluation and provision of chronic care management services.  ? ?SDOH:  (Social Determinants of Health) assessments and interventions performed: Yes ? ?SDOH Screenings  ? ?Alcohol Screen: Low Risk   ? Last Alcohol Screening Score (AUDIT): 1  ?Depression (PHQ2-9): Low Risk   ? PHQ-2 Score: 0   ?Financial Resource Strain: Low Risk   ? Difficulty of Paying Living Expenses: Not hard at all  ?Food Insecurity: No Food Insecurity  ? Worried About Charity fundraiser in  the Last Year: Never true  ? Ran Out of Food in the Last Year: Never true  ?Housing: Low Risk   ? Last Housing Risk Score: 0  ?Physical Activity: Sufficiently Active  ? Days of Exercise per Week: 7 days  ? Minutes of Exercise per Session: 60 min  ?Social Connections: Socially Integrated  ? Frequency of Communication with Friends and Family: More than three times a week  ? Frequency of Social Gatherings with Friends and Family: More than three times a week  ? Attends Religious Services: More than 4 times per year  ? Active Member of Clubs or Organizations: No  ? Attends Archivist Meetings: More than 4 times per year  ? Marital Status: Married  ?Stress: No Stress Concern Present  ? Feeling of Stress : Not at all  ?Tobacco Use: Low Risk   ? Smoking Tobacco Use: Never  ? Smokeless Tobacco Use: Never  ? Passive Exposure: Not on file  ?Transportation Needs: No Transportation Needs  ? Lack of Transportation (Medical): No  ? Lack of Transportation (Non-Medical): No  ? ? ?CCM Care Plan ? ?No Known Allergies ? ?Medications Reviewed Today   ? ? Reviewed by Tomasa Blase, [facility] (Pharmacist) on 04/11/22 at [phone]  Med List Status: <None>  ? ?Medication Order Taking? Sig Documenting Provider Last Dose Status Informant  ?acetaminophen (TYLENOL) 325 MG tablet [id number] Yes Take 650 mg by mouth every 6 (six) hours as needed. [provider] Taking Active Self  ?aspirin 81 MG tablet [id number] Yes Take 81 mg by mouth 2 (two) times daily. [provider] Taking Active Self  ?benazepril (LOTENSIN) 40 MG tablet [id number] Yes Take 1 tablet (40 mg total) by mouth daily. Hoyt Koch, MD Taking Active   ?glucose blood (FREESTYLE LITE) test strip [id number] Yes USE TO TEST BLOOD SUGAR ONCE DAILY ICD E11 59 Hoyt Koch, MD  Taking Active   ?ibuprofen (ADVIL) 200 MG tablet [id number] Yes Take 200 mg by mouth every 6 (six) hours as needed. [provider] Taking Active Self  ?Lancets (FREESTYLE) lancets [id number] Yes USE T

## 2022-04-11 NOTE — Patient Instructions (Signed)
Visit Information ? ?Following are the goals we discussed today:  ? ?Manage My Medicine  ? ?Timeframe:  Long-Range Goal ?Priority:  Medium ?Start Date:     02/21/21                        ?Expected End Date:     04/12/2023              ? ?Follow Up Date 03/2023 ?  ?- call for medicine refill 2 or 3 days before it runs out ?- call if I am sick and can't take my medicine ?- keep a list of all the medicines I take; vitamins and herbals too ?- use a pillbox to sort medicine  ? ?Why is this important?   ?These steps will help you keep on track with your medicines. ? ?Plan: Telephone follow up appointment with care management team member scheduled for:  12 months ?The patient has been provided with contact information for the care management team and has been advised to call with any health related questions or concerns.  ? ?Tomasa Blase, PharmD ?Clinical Pharmacist, [place]  ? ?Please call the care guide team at [phone] if you need to cancel or reschedule your appointment.  ? ?Patient verbalizes understanding of instructions and care plan provided today and agrees to view in [place]. Active MyChart status confirmed with patient.   ? ?

## 2022-05-07 ENCOUNTER — Ambulatory Visit (INDEPENDENT_AMBULATORY_CARE_PROVIDER_SITE_OTHER): Payer: Medicare Other | Admitting: Internal Medicine

## 2022-05-07 ENCOUNTER — Encounter: Payer: Self-pay | Admitting: Internal Medicine

## 2022-05-07 VITALS — BP 118/70 | HR 51 | Resp 18 | Ht 70.0 in | Wt 158.0 lb

## 2022-05-07 DIAGNOSIS — R300 Dysuria: Secondary | ICD-10-CM | POA: Diagnosis not present

## 2022-05-07 DIAGNOSIS — E1169 Type 2 diabetes mellitus with other specified complication: Secondary | ICD-10-CM

## 2022-05-07 DIAGNOSIS — I10 Essential (primary) hypertension: Secondary | ICD-10-CM

## 2022-05-07 DIAGNOSIS — E785 Hyperlipidemia, unspecified: Secondary | ICD-10-CM

## 2022-05-07 DIAGNOSIS — E1151 Type 2 diabetes mellitus with diabetic peripheral angiopathy without gangrene: Secondary | ICD-10-CM | POA: Diagnosis not present

## 2022-05-07 LAB — COMPREHENSIVE METABOLIC PANEL
ALT: 13 U/L (ref 0–53)
AST: 14 U/L (ref 0–37)
Albumin: 4.2 g/dL (ref 3.5–5.2)
Alkaline Phosphatase: 57 U/L (ref 39–117)
BUN: 13 mg/dL (ref 6–23)
CO2: 28 mEq/L (ref 19–32)
Calcium: 9.4 mg/dL (ref 8.4–10.5)
Chloride: 103 mEq/L (ref 96–112)
Creatinine, Ser: 0.89 mg/dL (ref 0.40–1.50)
GFR: 82.25 mL/min (ref >60.00)
Glucose, Bld: 170 mg/dL — ABNORMAL HIGH (ref 70–99)
Potassium: 3.8 mEq/L (ref 3.5–5.1)
Sodium: 139 mEq/L (ref 135–145)
Total Bilirubin: 0.8 mg/dL (ref 0.2–1.2)
Total Protein: 6.7 g/dL (ref 6.0–8.3)

## 2022-05-07 LAB — POCT URINALYSIS DIPSTICK
Bilirubin, UA: NEGATIVE
Glucose, UA: NEGATIVE
Ketones, UA: NEGATIVE
Nitrite, UA: NEGATIVE
Protein, UA: NEGATIVE
Spec Grav, UA: 1.01 (ref 1.010–1.025)
Urobilinogen, UA: 0.2 E.U./dL
pH, UA: 6 (ref 5.0–8.0)

## 2022-05-07 LAB — MICROALBUMIN / CREATININE URINE RATIO
Creatinine,U: 38.9 mg/dL
Microalb Creat Ratio: 8.9 mg/g (ref 0.0–30.0)
Microalb, Ur: 3.5 mg/dL — ABNORMAL HIGH (ref 0.0–1.9)

## 2022-05-07 LAB — CBC
HCT: 42 % (ref 39.0–52.0)
Hemoglobin: 14.2 g/dL (ref 13.0–17.0)
MCHC: 33.7 g/dL (ref 30.0–36.0)
MCV: 90.2 fl (ref 78.0–100.0)
Platelets: 114 10*3/uL — ABNORMAL LOW (ref 150.0–400.0)
RBC: 4.65 Mil/uL (ref 4.22–5.81)
RDW: 13.4 % (ref 11.5–15.5)
WBC: 9.1 10*3/uL (ref 4.0–10.5)

## 2022-05-07 LAB — LIPID PANEL
Cholesterol: 143 mg/dL (ref 0–200)
HDL: 51.1 mg/dL (ref >39.00)
LDL Cholesterol: 75 mg/dL (ref 0–99)
NonHDL: 91.7
Total CHOL/HDL Ratio: 3
Triglycerides: 83 mg/dL (ref 0.0–149.0)
VLDL: 16.6 mg/dL (ref 0.0–40.0)

## 2022-05-07 MED ORDER — CEPHALEXIN 500 MG PO CAPS: 500.0000 mg | ORAL_CAPSULE | Freq: Two times a day (BID) | ORAL | 0 refills | Status: AC

## 2022-05-07 NOTE — Progress Notes (Unsigned)
   Subjective:   Patient ID: Christopher Bradshaw, male    DOB: 1946/03/01, 76 y.o.   MRN: [id number]  HPI The patient is a 76 YO man coming in for possible uti. Also needs follow up.  Review of Systems  Constitutional: Negative.   HENT: Negative.    Eyes: Negative.   Respiratory:  Negative for cough, chest tightness and shortness of breath.   Cardiovascular:  Negative for chest pain, palpitations and leg swelling.  Gastrointestinal:  Negative for abdominal distention, abdominal pain, constipation, diarrhea, nausea and vomiting.  Genitourinary:  Positive for frequency and urgency.  Musculoskeletal: Negative.   Skin: Negative.   Neurological: Negative.   Psychiatric/Behavioral: Negative.     Objective:  Physical Exam Constitutional:      Appearance: He is well-developed.  HENT:     Head: Normocephalic and atraumatic.  Cardiovascular:     Rate and Rhythm: Normal rate and regular rhythm.  Pulmonary:     Effort: Pulmonary effort is normal. No respiratory distress.     Breath sounds: Normal breath sounds. No wheezing or rales.  Abdominal:     General: Bowel sounds are normal. There is no distension.     Palpations: Abdomen is soft.     Tenderness: There is no abdominal tenderness. There is no rebound.  Musculoskeletal:     Cervical back: Normal range of motion.  Skin:    General: Skin is warm and dry.  Neurological:     Mental Status: He is alert and oriented to person, place, and time.     Coordination: Coordination normal.    Vitals:   05/07/22 1554  BP: 118/70  Pulse: (!) 51  Resp: 18  SpO2: 99%  Weight: 158 lb (71.7 kg)  Height: '5\' 10"'$  (1.778 m)    Assessment & Plan:

## 2022-05-07 NOTE — Patient Instructions (Signed)
We will check the labs today.  We have sent in keflex which is the antibiotic to help the urine infection 1 pill twice a day for 5 days.

## 2022-05-08 LAB — HEMOGLOBIN A1C: Hgb A1c MFr Bld: 9.9 % — ABNORMAL HIGH (ref 4.6–6.5)

## 2022-05-09 ENCOUNTER — Other Ambulatory Visit: Payer: Self-pay | Admitting: Internal Medicine

## 2022-05-09 DIAGNOSIS — E1169 Type 2 diabetes mellitus with other specified complication: Secondary | ICD-10-CM

## 2022-05-09 DIAGNOSIS — E1151 Type 2 diabetes mellitus with diabetic peripheral angiopathy without gangrene: Secondary | ICD-10-CM

## 2022-05-10 ENCOUNTER — Encounter: Payer: Self-pay | Admitting: Internal Medicine

## 2022-05-10 DIAGNOSIS — R300 Dysuria: Secondary | ICD-10-CM | POA: Insufficient documentation

## 2022-05-10 NOTE — Assessment & Plan Note (Signed)
POC U/A done in office consistent with infection. Rx keflex 5 day course.

## 2022-05-10 NOTE — Assessment & Plan Note (Signed)
Labs overdue will check will here. Checking HgA1c and microalbumin to creatinine ratio. Foot exam done. Adjust metformin 500 mg BID as needed. On ACE-I and statin.

## 2022-05-10 NOTE — Assessment & Plan Note (Signed)
BP at goal on benazepril 40 mg daily. Checking CMP as overdue. Adjust as needed.

## 2022-05-10 NOTE — Assessment & Plan Note (Signed)
Checking lipid panel and adjust simvastatin 40 mg daily as needed for LDL goal <100.

## 2022-05-15 ENCOUNTER — Telehealth: Payer: Self-pay

## 2022-05-15 MED ORDER — NITROFURANTOIN MONOHYD MACRO 100 MG PO CAPS: 100.0000 mg | ORAL_CAPSULE | Freq: Two times a day (BID) | ORAL | 0 refills | Status: DC

## 2022-05-15 NOTE — Telephone Encounter (Signed)
Pt is calling to report that he still feels as if he has a UTI. Pt was seen on 5/22. Pt has completed the Abx that was given.   Pt reports that bleed has stopped but still has burning, urgency, and frequency about every 2 hrs. Pt states that its really painful.   Pt is requesting a refill on cephALEXin (KEFLEX) 500 MG capsule  or something different to help treat.   Pharmacy: [facility] [address], [place]  Please advise

## 2022-05-15 NOTE — Telephone Encounter (Signed)
Sent in medication to help

## 2022-05-23 DIAGNOSIS — N3001 Acute cystitis with hematuria: Secondary | ICD-10-CM | POA: Diagnosis not present

## 2022-05-23 DIAGNOSIS — R300 Dysuria: Secondary | ICD-10-CM | POA: Diagnosis not present

## 2022-05-23 DIAGNOSIS — N401 Enlarged prostate with lower urinary tract symptoms: Secondary | ICD-10-CM | POA: Diagnosis not present

## 2022-05-23 DIAGNOSIS — R350 Frequency of micturition: Secondary | ICD-10-CM | POA: Diagnosis not present

## 2022-05-29 ENCOUNTER — Other Ambulatory Visit: Payer: Self-pay | Admitting: Internal Medicine

## 2022-05-29 DIAGNOSIS — N3289 Other specified disorders of bladder: Secondary | ICD-10-CM | POA: Diagnosis not present

## 2022-05-29 DIAGNOSIS — N3001 Acute cystitis with hematuria: Secondary | ICD-10-CM | POA: Diagnosis not present

## 2022-05-29 DIAGNOSIS — Z87442 Personal history of urinary calculi: Secondary | ICD-10-CM | POA: Diagnosis not present

## 2022-05-29 DIAGNOSIS — R319 Hematuria, unspecified: Secondary | ICD-10-CM | POA: Diagnosis not present

## 2022-05-29 DIAGNOSIS — N281 Cyst of kidney, acquired: Secondary | ICD-10-CM | POA: Diagnosis not present

## 2022-05-29 DIAGNOSIS — I10 Essential (primary) hypertension: Secondary | ICD-10-CM

## 2022-05-29 DIAGNOSIS — N400 Enlarged prostate without lower urinary tract symptoms: Secondary | ICD-10-CM | POA: Diagnosis not present

## 2022-06-08 ENCOUNTER — Telehealth: Payer: Self-pay | Admitting: Internal Medicine

## 2022-06-08 NOTE — Telephone Encounter (Signed)
Pt requesting taking 1 Metformin  due to blood sugar dropping while taking Cephalexin dosage Pt was advised by Urologist to see if he could decrease in case blood sugar dropped  Please Advise

## 2022-06-21 DIAGNOSIS — N3001 Acute cystitis with hematuria: Secondary | ICD-10-CM | POA: Diagnosis not present

## 2022-07-24 DIAGNOSIS — R519 Headache, unspecified: Secondary | ICD-10-CM | POA: Diagnosis not present

## 2022-07-24 DIAGNOSIS — M47812 Spondylosis without myelopathy or radiculopathy, cervical region: Secondary | ICD-10-CM | POA: Diagnosis not present

## 2022-07-24 DIAGNOSIS — M542 Cervicalgia: Secondary | ICD-10-CM | POA: Diagnosis not present

## 2022-07-24 DIAGNOSIS — S060X0A Concussion without loss of consciousness, initial encounter: Secondary | ICD-10-CM | POA: Diagnosis not present

## 2022-07-24 DIAGNOSIS — R0789 Other chest pain: Secondary | ICD-10-CM | POA: Diagnosis not present

## 2022-07-24 DIAGNOSIS — M4802 Spinal stenosis, cervical region: Secondary | ICD-10-CM | POA: Diagnosis not present

## 2022-07-25 DIAGNOSIS — N401 Enlarged prostate with lower urinary tract symptoms: Secondary | ICD-10-CM | POA: Diagnosis not present

## 2022-07-25 DIAGNOSIS — R3915 Urgency of urination: Secondary | ICD-10-CM | POA: Diagnosis not present

## 2022-07-25 DIAGNOSIS — N3001 Acute cystitis with hematuria: Secondary | ICD-10-CM | POA: Diagnosis not present

## 2022-08-02 ENCOUNTER — Encounter: Payer: Self-pay | Admitting: Internal Medicine

## 2022-08-02 ENCOUNTER — Ambulatory Visit (INDEPENDENT_AMBULATORY_CARE_PROVIDER_SITE_OTHER): Payer: Medicare Other | Admitting: Internal Medicine

## 2022-08-02 DIAGNOSIS — S060X0A Concussion without loss of consciousness, initial encounter: Secondary | ICD-10-CM

## 2022-08-02 DIAGNOSIS — M549 Dorsalgia, unspecified: Secondary | ICD-10-CM

## 2022-08-02 DIAGNOSIS — M542 Cervicalgia: Secondary | ICD-10-CM
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
## 2022-08-02 NOTE — Patient Instructions (Signed)
Keep using ice or heat for the pain. It is okay to use the ibuprofen or tylenol or cyclobenzaprine for the pain.

## 2022-08-03 DIAGNOSIS — M542 Cervicalgia: Secondary | ICD-10-CM | POA: Insufficient documentation

## 2022-08-03 DIAGNOSIS — S060XAA Concussion with loss of consciousness status unknown, initial encounter: Secondary | ICD-10-CM | POA: Insufficient documentation

## 2022-08-03 DIAGNOSIS — M549 Dorsalgia, unspecified: Secondary | ICD-10-CM | POA: Insufficient documentation

## 2022-08-03 NOTE — Assessment & Plan Note (Signed)
Some persistent headaches and sensitivity to light. Advised to use ibuprofen and plenty of fluids. Gradually increase activity level.

## 2022-08-03 NOTE — Assessment & Plan Note (Signed)
Use ibuprofen and flexeril for pain. He has plenty and will inform us if he needs refill. Expected timeline for recovery 4-6 weeks.

## 2022-08-03 NOTE — Assessment & Plan Note (Signed)
Can use ibuprofen and flexeril for pain as well as tylenol and salon pas. Expected timeline for recovery 4-6 weeks. If no improvement we can do physical therapy.

## 2022-08-06 DIAGNOSIS — Z961 Presence of intraocular lens: Secondary | ICD-10-CM | POA: Diagnosis not present

## 2022-08-06 DIAGNOSIS — H524 Presbyopia: Secondary | ICD-10-CM | POA: Diagnosis not present

## 2022-08-06 DIAGNOSIS — E119 Type 2 diabetes mellitus without complications: Secondary | ICD-10-CM | POA: Diagnosis not present

## 2022-08-06 DIAGNOSIS — Z7984 Long term (current) use of oral hypoglycemic drugs: Secondary | ICD-10-CM | POA: Diagnosis not present

## 2022-08-06 LAB — HM DIABETES EYE EXAM

## 2022-08-13 ENCOUNTER — Ambulatory Visit (INDEPENDENT_AMBULATORY_CARE_PROVIDER_SITE_OTHER): Payer: Medicare Other | Admitting: Internal Medicine

## 2022-08-13 ENCOUNTER — Encounter: Payer: Self-pay | Admitting: Internal Medicine

## 2022-08-13 ENCOUNTER — Ambulatory Visit (INDEPENDENT_AMBULATORY_CARE_PROVIDER_SITE_OTHER): Payer: Medicare Other

## 2022-08-13 VITALS — BP 130/74 | HR 60 | Temp 97.9°F | Ht 70.0 in | Wt 151.2 lb

## 2022-08-13 DIAGNOSIS — M47816 Spondylosis without myelopathy or radiculopathy, lumbar region: Secondary | ICD-10-CM | POA: Diagnosis not present

## 2022-08-13 DIAGNOSIS — M5442 Lumbago with sciatica, left side: Secondary | ICD-10-CM | POA: Diagnosis not present

## 2022-08-13 DIAGNOSIS — M545 Low back pain, unspecified: Secondary | ICD-10-CM | POA: Diagnosis not present

## 2022-08-13 DIAGNOSIS — M5441 Lumbago with sciatica, right side: Secondary | ICD-10-CM | POA: Diagnosis not present

## 2022-08-13 MED ORDER — CYCLOBENZAPRINE HCL 10 MG PO TABS: 10.0000 mg | ORAL_TABLET | Freq: Three times a day (TID) | ORAL | 0 refills | Status: DC | PRN

## 2022-08-13 MED ORDER — IBUPROFEN 800 MG PO TABS: 800.0000 mg | ORAL_TABLET | Freq: Three times a day (TID) | ORAL | 1 refills | Status: DC
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
## 2022-08-13 NOTE — Patient Instructions (Signed)
We will do the x-ray today.  We have refilled the ibuprofen and the cyclobenzaprine (muscle relaxer) to use for pain.

## 2022-08-27 ENCOUNTER — Other Ambulatory Visit: Payer: Self-pay | Admitting: Internal Medicine

## 2022-08-27 DIAGNOSIS — I10 Essential (primary) hypertension: Secondary | ICD-10-CM

## 2022-08-27 MED ORDER — BENAZEPRIL HCL 40 MG PO TABS: 40.0000 mg | ORAL_TABLET | Freq: Every day | ORAL | 1 refills | Status: DC

## 2022-09-04 DIAGNOSIS — N3001 Acute cystitis with hematuria: Secondary | ICD-10-CM | POA: Diagnosis not present

## 2022-09-12 DIAGNOSIS — N401 Enlarged prostate with lower urinary tract symptoms: Secondary | ICD-10-CM | POA: Diagnosis not present

## 2022-09-12 DIAGNOSIS — R351 Nocturia: Secondary | ICD-10-CM | POA: Diagnosis not present

## 2022-09-12 DIAGNOSIS — N3021 Other chronic cystitis with hematuria: Secondary | ICD-10-CM | POA: Diagnosis not present

## 2022-09-12 DIAGNOSIS — R3915 Urgency of urination: Secondary | ICD-10-CM | POA: Diagnosis not present

## 2022-11-13 ENCOUNTER — Other Ambulatory Visit: Payer: Self-pay | Admitting: Internal Medicine

## 2022-11-13 DIAGNOSIS — E1169 Type 2 diabetes mellitus with other specified complication: Secondary | ICD-10-CM

## 2022-11-13 DIAGNOSIS — E1151 Type 2 diabetes mellitus with diabetic peripheral angiopathy without gangrene: Secondary | ICD-10-CM

## 2023-01-07 DIAGNOSIS — R351 Nocturia: Secondary | ICD-10-CM | POA: Diagnosis not present

## 2023-01-07 DIAGNOSIS — N5201 Erectile dysfunction due to arterial insufficiency: Secondary | ICD-10-CM | POA: Diagnosis not present

## 2023-01-07 DIAGNOSIS — N401 Enlarged prostate with lower urinary tract symptoms: Secondary | ICD-10-CM | POA: Diagnosis not present

## 2023-01-07 DIAGNOSIS — N3021 Other chronic cystitis with hematuria: Secondary | ICD-10-CM | POA: Diagnosis not present

## 2023-02-07 ENCOUNTER — Ambulatory Visit (INDEPENDENT_AMBULATORY_CARE_PROVIDER_SITE_OTHER): Payer: Medicare Other

## 2023-02-07 ENCOUNTER — Telehealth: Payer: Self-pay

## 2023-02-07 VITALS — Ht 70.0 in | Wt 152.0 lb

## 2023-02-07 DIAGNOSIS — Z0000 Encounter for general adult medical examination without abnormal findings: Secondary | ICD-10-CM | POA: Diagnosis not present

## 2023-02-07 NOTE — Telephone Encounter (Signed)
AWV-S was completed today via phone.  Thresa Dozier N. Briseida Gittings, LPN. [facility] Team Direct Dial: [phone]

## 2023-02-07 NOTE — Patient Instructions (Addendum)
Christopher Bradshaw , Thank you for taking time to come for your Medicare Wellness Visit. I appreciate your ongoing commitment to your health goals. Please review the following plan we discussed and let me know if I can assist you in the future.   These are the goals we discussed:  Goals      Manage My Medicine     Timeframe:  Long-Range Goal Priority:  Medium Start Date:     02/21/21                        Expected End Date:     04/12/2023               Follow Up Date 03/2023   - call for medicine refill 2 or 3 days before it runs out - call if I am sick and can't take my medicine - keep a list of all the medicines I take; vitamins and herbals too - use a pillbox to sort medicine   Why is this important?   These steps will help you keep on track with your medicines.   Notes:      Patient Stated     My goal is to get back to walking and being more physically active.        This is a list of the screening recommended for you and due dates:  Health Maintenance  Topic Date Due   Hepatitis C Screening: USPSTF Recommendation to screen - Ages 25-79 yo.  Never done   Eye exam for diabetics  08/03/2022   Zoster (Shingles) Vaccine (2 of 2) 08/28/2022   Hemoglobin A1C  11/07/2022   Yearly kidney function blood test for diabetes  05/08/2023   Yearly kidney health urinalysis for diabetes  05/08/2023   Complete foot exam   05/08/2023   Medicare Annual Wellness Visit  02/08/2024   DTaP/Tdap/Td vaccine (3 - Td or Tdap) 06/02/2029   Pneumonia Vaccine  Completed   Flu Shot  Completed   COVID-19 Vaccine  Completed   HPV Vaccine  Aged Out   Colon Cancer Screening  Discontinued    Advanced directives: No; Advance directive discussed with you today. Even though you declined this today please call our office should you change your mind and we can give you the proper paperwork for you to fill out.  Conditions/risks identified: Yes; Type II Diabetes Mellitus  Next appointment: Follow up in one year  for your annual wellness visit.   Preventive Care 31 Years and Older, Male  Preventive care refers to lifestyle choices and visits with your health care provider that can promote health and wellness. What does preventive care include? A yearly physical exam. This is also called an annual well check. Dental exams once or twice a year. Routine eye exams. Ask your health care provider how often you should have your eyes checked. Personal lifestyle choices, including: Daily care of your teeth and gums. Regular physical activity. Eating a healthy diet. Avoiding tobacco and drug use. Limiting alcohol use. Practicing safe sex. Taking low doses of aspirin every day. Taking vitamin and mineral supplements as recommended by your health care provider. What happens during an annual well check? The services and screenings done by your health care provider during your annual well check will depend on your age, overall health, lifestyle risk factors, and family history of disease. Counseling  Your health care provider may ask you questions about your: Alcohol use. Tobacco use. Drug use.  Emotional well-being. Home and relationship well-being. Sexual activity. Eating habits. History of falls. Memory and ability to understand (cognition). Work and work Statistician. Screening  You may have the following tests or measurements: Height, weight, and BMI. Blood pressure. Lipid and cholesterol levels. These may be checked every 5 years, or more frequently if you are over 63 years old. Skin check. Lung cancer screening. You may have this screening every year starting at age 22 if you have a 30-pack-year history of smoking and currently smoke or have quit within the past 15 years. Fecal occult blood test (FOBT) of the stool. You may have this test every year starting at age 39. Flexible sigmoidoscopy or colonoscopy. You may have a sigmoidoscopy every 5 years or a colonoscopy every 10 years starting at  age 67. Prostate cancer screening. Recommendations will vary depending on your family history and other risks. Hepatitis C blood test. Hepatitis B blood test. Sexually transmitted disease (STD) testing. Diabetes screening. This is done by checking your blood sugar (glucose) after you have not eaten for a while (fasting). You may have this done every 1-3 years. Abdominal aortic aneurysm (AAA) screening. You may need this if you are a current or former smoker. Osteoporosis. You may be screened starting at age 75 if you are at high risk. Talk with your health care provider about your test results, treatment options, and if necessary, the need for more tests. Vaccines  Your health care provider may recommend certain vaccines, such as: Influenza vaccine. This is recommended every year. Tetanus, diphtheria, and acellular pertussis (Tdap, Td) vaccine. You may need a Td booster every 10 years. Zoster vaccine. You may need this after age 10. Pneumococcal 13-valent conjugate (PCV13) vaccine. One dose is recommended after age 45. Pneumococcal polysaccharide (PPSV23) vaccine. One dose is recommended after age 16. Talk to your health care provider about which screenings and vaccines you need and how often you need them. This information is not intended to replace advice given to you by your health care provider. Make sure you discuss any questions you have with your health care provider. Document Released: 12/30/2015 Document Revised: 08/22/2016 Document Reviewed: 10/04/2015 Elsevier Interactive Patient Education  2017 [place] Prevention in the Home Falls can cause injuries. They can happen to people of all ages. There are many things you can do to make your home safe and to help prevent falls. What can I do on the outside of my home? Regularly fix the edges of walkways and driveways and fix any cracks. Remove anything that might make you trip as you walk through a door, such as a raised  step or threshold. Trim any bushes or trees on the path to your home. Use bright outdoor lighting. Clear any walking paths of anything that might make someone trip, such as rocks or tools. Regularly check to see if handrails are loose or broken. Make sure that both sides of any steps have handrails. Any raised decks and porches should have guardrails on the edges. Have any leaves, snow, or ice cleared regularly. Use sand or salt on walking paths during winter. Clean up any spills in your garage right away. This includes oil or grease spills. What can I do in the bathroom? Use night lights. Install grab bars by the toilet and in the tub and shower. Do not use towel bars as grab bars. Use non-skid mats or decals in the tub or shower. If you need to sit down in the shower, use a  plastic, non-slip stool. Keep the floor dry. Clean up any water that spills on the floor as soon as it happens. Remove soap buildup in the tub or shower regularly. Attach bath mats securely with double-sided non-slip rug tape. Do not have throw rugs and other things on the floor that can make you trip. What can I do in the bedroom? Use night lights. Make sure that you have a light by your bed that is easy to reach. Do not use any sheets or blankets that are too big for your bed. They should not hang down onto the floor. Have a firm chair that has side arms. You can use this for support while you get dressed. Do not have throw rugs and other things on the floor that can make you trip. What can I do in the kitchen? Clean up any spills right away. Avoid walking on wet floors. Keep items that you use a lot in easy-to-reach places. If you need to reach something above you, use a strong step stool that has a grab bar. Keep electrical cords out of the way. Do not use floor polish or wax that makes floors slippery. If you must use wax, use non-skid floor wax. Do not have throw rugs and other things on the floor that can  make you trip. What can I do with my stairs? Do not leave any items on the stairs. Make sure that there are handrails on both sides of the stairs and use them. Fix handrails that are broken or loose. Make sure that handrails are as long as the stairways. Check any carpeting to make sure that it is firmly attached to the stairs. Fix any carpet that is loose or worn. Avoid having throw rugs at the top or bottom of the stairs. If you do have throw rugs, attach them to the floor with carpet tape. Make sure that you have a light switch at the top of the stairs and the bottom of the stairs. If you do not have them, ask someone to add them for you. What else can I do to help prevent falls? Wear shoes that: Do not have high heels. Have rubber bottoms. Are comfortable and fit you well. Are closed at the toe. Do not wear sandals. If you use a stepladder: Make sure that it is fully opened. Do not climb a closed stepladder. Make sure that both sides of the stepladder are locked into place. Ask someone to hold it for you, if possible. Clearly mark and make sure that you can see: Any grab bars or handrails. First and last steps. Where the edge of each step is. Use tools that help you move around (mobility aids) if they are needed. These include: Canes. Walkers. Scooters. Crutches. Turn on the lights when you go into a dark area. Replace any light bulbs as soon as they burn out. Set up your furniture so you have a clear path. Avoid moving your furniture around. If any of your floors are uneven, fix them. If there are any pets around you, be aware of where they are. Review your medicines with your doctor. Some medicines can make you feel dizzy. This can increase your chance of falling. Ask your doctor what other things that you can do to help prevent falls. This information is not intended to replace advice given to you by your health care provider. Make sure you discuss any questions you have with  your health care provider. Document Released: 09/29/2009 Document Revised: 05/10/2016  Document Reviewed: 01/07/2015 Elsevier Interactive Patient Education  2017 [employer].
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
## 2023-02-11 ENCOUNTER — Other Ambulatory Visit: Payer: Self-pay | Admitting: Internal Medicine

## 2023-02-11 DIAGNOSIS — E1151 Type 2 diabetes mellitus with diabetic peripheral angiopathy without gangrene: Secondary | ICD-10-CM

## 2023-02-11 DIAGNOSIS — E785 Hyperlipidemia, unspecified: Secondary | ICD-10-CM

## 2023-02-11 MED ORDER — SIMVASTATIN 40 MG PO TABS: 40.0000 mg | ORAL_TABLET | Freq: Every day | ORAL | 0 refills | Status: DC

## 2023-02-11 MED ORDER — METFORMIN HCL 500 MG PO TABS: 500.0000 mg | ORAL_TABLET | Freq: Two times a day (BID) | ORAL | 0 refills | Status: DC

## 2023-02-19 ENCOUNTER — Other Ambulatory Visit: Payer: Self-pay | Admitting: Internal Medicine

## 2023-02-19 DIAGNOSIS — I10 Essential (primary) hypertension: Secondary | ICD-10-CM

## 2023-02-21 DIAGNOSIS — N3021 Other chronic cystitis with hematuria: Secondary | ICD-10-CM | POA: Diagnosis not present

## 2023-02-25 DIAGNOSIS — K080 Exfoliation of teeth due to systemic causes: Secondary | ICD-10-CM | POA: Diagnosis not present

## 2023-03-01 DIAGNOSIS — N3021 Other chronic cystitis with hematuria: Secondary | ICD-10-CM | POA: Diagnosis not present

## 2023-03-25 ENCOUNTER — Ambulatory Visit (INDEPENDENT_AMBULATORY_CARE_PROVIDER_SITE_OTHER): Payer: Medicare Other | Admitting: Internal Medicine

## 2023-03-25 ENCOUNTER — Encounter: Payer: Self-pay | Admitting: Internal Medicine

## 2023-03-25 VITALS — BP 130/98 | HR 76 | Temp 97.6°F | Ht 70.0 in | Wt 139.0 lb

## 2023-03-25 DIAGNOSIS — R300 Dysuria: Secondary | ICD-10-CM | POA: Diagnosis not present

## 2023-03-25 DIAGNOSIS — E1169 Type 2 diabetes mellitus with other specified complication: Secondary | ICD-10-CM

## 2023-03-25 DIAGNOSIS — E1151 Type 2 diabetes mellitus with diabetic peripheral angiopathy without gangrene: Secondary | ICD-10-CM

## 2023-03-25 DIAGNOSIS — E785 Hyperlipidemia, unspecified: Secondary | ICD-10-CM

## 2023-03-25 DIAGNOSIS — I10 Essential (primary) hypertension: Secondary | ICD-10-CM | POA: Diagnosis not present

## 2023-03-25 LAB — POCT URINALYSIS DIPSTICK
Bilirubin, UA: NEGATIVE
Blood, UA: POSITIVE — CR
Glucose, UA: POSITIVE — AB
Ketones, UA: 5
Nitrite, UA: NEGATIVE
Protein, UA: POSITIVE — AB
Spec Grav, UA: >=1.03 — AB (ref 1.010–1.025)
Urobilinogen, UA: NEGATIVE E.U./dL — AB
pH, UA: 6 (ref 5.0–8.0)

## 2023-03-25 LAB — POCT GLYCOSYLATED HEMOGLOBIN (HGB A1C): HbA1c POC (<> result, manual entry): 11.4 % (ref 4.0–5.6)

## 2023-03-25 MED ORDER — METFORMIN HCL 1000 MG PO TABS: 1000.0000 mg | ORAL_TABLET | Freq: Two times a day (BID) | ORAL | 3 refills | Status: DC

## 2023-03-25 MED ORDER — CEPHALEXIN 500 MG PO CAPS
ORAL_CAPSULE | ORAL | 1 refills | Status: DC
Start: 2023-03-25 — End: 2023-05-10

## 2023-03-25 NOTE — Patient Instructions (Signed)
We have sent in keflex to take 1 pill 3 times a day for 1 week then take 1 pill daily ongoing to prevent infection.  We are increasing the metformin to 1000 mg twice a day. You currently have 500 mg so you can take 2 pills of this twice a day until gone.
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
## 2023-03-26 NOTE — Assessment & Plan Note (Signed)
POC U/A done consistent with infection. We discussed high amounts of sugar in the urine make UTI more likely. Rx keflex TID for 1 week then maintain keflex 500 mg daily for prevention until sugars are under control.
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
## 2023-03-26 NOTE — Assessment & Plan Note (Signed)
BP is elevated today and off diet and exercise and medications lately. He has started back within 1 week. Follow up close and adjust as needed. For now keep benazepril 40 mg daily

## 2023-04-10 ENCOUNTER — Telehealth: Payer: Medicare Other

## 2023-05-09 ENCOUNTER — Encounter: Payer: Medicare Other | Admitting: Internal Medicine

## 2023-05-10 ENCOUNTER — Encounter: Payer: Self-pay | Admitting: Internal Medicine

## 2023-05-10 ENCOUNTER — Ambulatory Visit (INDEPENDENT_AMBULATORY_CARE_PROVIDER_SITE_OTHER): Payer: Medicare Other | Admitting: Internal Medicine

## 2023-05-10 VITALS — BP 122/82 | HR 83 | Temp 97.8°F | Ht 70.0 in | Wt 141.0 lb

## 2023-05-10 DIAGNOSIS — Z1159 Encounter for screening for other viral diseases: Secondary | ICD-10-CM | POA: Diagnosis not present

## 2023-05-10 DIAGNOSIS — I10 Essential (primary) hypertension: Secondary | ICD-10-CM

## 2023-05-10 DIAGNOSIS — E1151 Type 2 diabetes mellitus with diabetic peripheral angiopathy without gangrene: Secondary | ICD-10-CM | POA: Diagnosis not present

## 2023-05-10 DIAGNOSIS — E1169 Type 2 diabetes mellitus with other specified complication: Secondary | ICD-10-CM | POA: Diagnosis not present

## 2023-05-10 DIAGNOSIS — E785 Hyperlipidemia, unspecified: Secondary | ICD-10-CM

## 2023-05-10 DIAGNOSIS — N400 Enlarged prostate without lower urinary tract symptoms: Secondary | ICD-10-CM

## 2023-05-10 DIAGNOSIS — Z0000 Encounter for general adult medical examination without abnormal findings: Secondary | ICD-10-CM | POA: Diagnosis not present

## 2023-05-10 LAB — COMPREHENSIVE METABOLIC PANEL
ALT: 11 U/L (ref 0–53)
AST: 14 U/L (ref 0–37)
Albumin: 4.1 g/dL (ref 3.5–5.2)
Alkaline Phosphatase: 49 U/L (ref 39–117)
BUN: 13 mg/dL (ref 6–23)
CO2: 30 mEq/L (ref 19–32)
Calcium: 9.1 mg/dL (ref 8.4–10.5)
Chloride: 105 mEq/L (ref 96–112)
Creatinine, Ser: 0.7 mg/dL (ref 0.40–1.50)
GFR: 87.81 mL/min (ref >60.00)
Glucose, Bld: 100 mg/dL — ABNORMAL HIGH (ref 70–99)
Potassium: 4.1 mEq/L (ref 3.5–5.1)
Sodium: 143 mEq/L (ref 135–145)
Total Bilirubin: 0.8 mg/dL (ref 0.2–1.2)
Total Protein: 6.5 g/dL (ref 6.0–8.3)

## 2023-05-10 LAB — CBC
HCT: 41.7 % (ref 39.0–52.0)
Hemoglobin: 14 g/dL (ref 13.0–17.0)
MCHC: 33.6 g/dL (ref 30.0–36.0)
MCV: 92.5 fl (ref 78.0–100.0)
Platelets: 114 10*3/uL — ABNORMAL LOW (ref 150.0–400.0)
RBC: 4.51 Mil/uL (ref 4.22–5.81)
RDW: 14.1 % (ref 11.5–15.5)
WBC: 8.2 10*3/uL (ref 4.0–10.5)

## 2023-05-10 LAB — MICROALBUMIN / CREATININE URINE RATIO
Creatinine,U: 108.6 mg/dL
Microalb Creat Ratio: 0.7 mg/g (ref 0.0–30.0)
Microalb, Ur: 0.8 mg/dL (ref 0.0–1.9)

## 2023-05-10 LAB — LIPID PANEL
Cholesterol: 123 mg/dL (ref 0–200)
HDL: 50.9 mg/dL (ref >39.00)
LDL Cholesterol: 59 mg/dL (ref 0–99)
NonHDL: 72.19
Total CHOL/HDL Ratio: 2
Triglycerides: 66 mg/dL (ref 0.0–149.0)
VLDL: 13.2 mg/dL (ref 0.0–40.0)

## 2023-05-10 MED ORDER — CONTOUR TEST VI STRP: ORAL_STRIP | 12 refills | Status: AC

## 2023-05-10 MED ORDER — SIMVASTATIN 40 MG PO TABS: 40.0000 mg | ORAL_TABLET | Freq: Every day | ORAL | 3 refills | Status: DC

## 2023-05-10 MED ORDER — CEPHALEXIN 500 MG PO CAPS: 500.0000 mg | ORAL_CAPSULE | Freq: Every day | ORAL | 3 refills | Status: AC

## 2023-05-10 MED ORDER — IBUPROFEN 800 MG PO TABS: 800.0000 mg | ORAL_TABLET | Freq: Three times a day (TID) | ORAL | 1 refills | Status: AC

## 2023-05-10 MED ORDER — BENAZEPRIL HCL 40 MG PO TABS: 40.0000 mg | ORAL_TABLET | Freq: Every day | ORAL | 3 refills | Status: DC

## 2023-05-10 MED ORDER — CONTOUR MONITOR DEVI: 0 refills | Status: AC

## 2023-05-10 MED ORDER — LANCET DEVICE MISC: 11 refills | Status: AC

## 2023-05-10 NOTE — Assessment & Plan Note (Signed)
He is no longer having UTI symptoms and will maintain keflex 500 mg daily.

## 2023-05-10 NOTE — Assessment & Plan Note (Signed)
Checking fructosamine and morning sugars are running 120s mostly which is markedly improved. Adjust metformin 1000 mg BID as needed. On ACE-I and statin. Foot exam done eye exam up to date.

## 2023-05-10 NOTE — Progress Notes (Signed)
   Subjective:   Patient ID: Christopher Bradshaw, male    DOB: 1946/01/23, 77 y.o.   MRN: [id number]  HPI The patient is here for physical.  PMH, [facility] social history reviewed and updated  Review of Systems  Constitutional: Negative.   HENT: Negative.    Eyes: Negative.   Respiratory:  Negative for cough, chest tightness and shortness of breath.   Cardiovascular:  Negative for chest pain, palpitations and leg swelling.  Gastrointestinal:  Negative for abdominal distention, abdominal pain, constipation, diarrhea, nausea and vomiting.  Musculoskeletal: Negative.   Skin: Negative.   Neurological: Negative.   Psychiatric/Behavioral: Negative.      Objective:  Physical Exam Constitutional:      Appearance: He is well-developed.  HENT:     Head: Normocephalic and atraumatic.  Cardiovascular:     Rate and Rhythm: Normal rate and regular rhythm.  Pulmonary:     Effort: Pulmonary effort is normal. No respiratory distress.     Breath sounds: Normal breath sounds. No wheezing or rales.  Abdominal:     General: Bowel sounds are normal. There is no distension.     Palpations: Abdomen is soft.     Tenderness: There is no abdominal tenderness. There is no rebound.  Musculoskeletal:     Cervical back: Normal range of motion.  Skin:    General: Skin is warm and dry.     Comments: Foot exam done  Neurological:     Mental Status: He is alert and oriented to person, place, and time.     Coordination: Coordination normal.     Vitals:   05/10/23 0851  BP: 122/82  Pulse: 83  Temp: 97.8 F (36.6 C)  TempSrc: Oral  SpO2: 99%  Weight: 141 lb (64 kg)  Height: 5\' 10"  (1.778 m)    Assessment & Plan:

## 2023-05-10 NOTE — Assessment & Plan Note (Signed)
Checking lipid panel and adjust simvastatin 40 mg daily as needed for LDL <70 goal.

## 2023-05-10 NOTE — Assessment & Plan Note (Signed)
BP now back to goal on medication. Keep benazepril 40 mg daily. Checking CMP and CBC and adjust as needed.
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
## 2023-05-10 NOTE — Patient Instructions (Signed)
We have sent in the new meter and will check the labs today.

## 2023-05-14 LAB — HEPATITIS C ANTIBODY: Hepatitis C Ab: NONREACTIVE

## 2023-05-14 LAB — FRUCTOSAMINE: Fructosamine: 322 umol/L — ABNORMAL HIGH (ref 205–285)

## 2023-07-03 DIAGNOSIS — N401 Enlarged prostate with lower urinary tract symptoms: Secondary | ICD-10-CM | POA: Diagnosis not present

## 2023-07-03 DIAGNOSIS — N3021 Other chronic cystitis with hematuria: Secondary | ICD-10-CM | POA: Diagnosis not present

## 2023-07-03 DIAGNOSIS — R3915 Urgency of urination: Secondary | ICD-10-CM | POA: Diagnosis not present

## 2023-07-03 DIAGNOSIS — N481 Balanitis: Secondary | ICD-10-CM | POA: Diagnosis not present

## 2023-07-17 ENCOUNTER — Encounter (INDEPENDENT_AMBULATORY_CARE_PROVIDER_SITE_OTHER): Payer: Self-pay

## 2023-08-12 ENCOUNTER — Ambulatory Visit (INDEPENDENT_AMBULATORY_CARE_PROVIDER_SITE_OTHER): Payer: Medicare Other | Admitting: Internal Medicine

## 2023-08-12 ENCOUNTER — Encounter: Payer: Self-pay | Admitting: Internal Medicine

## 2023-08-12 VITALS — BP 120/80 | HR 51 | Temp 98.4°F | Ht 70.0 in | Wt 142.0 lb

## 2023-08-12 DIAGNOSIS — Z23 Encounter for immunization: Secondary | ICD-10-CM

## 2023-08-12 DIAGNOSIS — E1151 Type 2 diabetes mellitus with diabetic peripheral angiopathy without gangrene: Secondary | ICD-10-CM

## 2023-08-12 DIAGNOSIS — Z7984 Long term (current) use of oral hypoglycemic drugs: Secondary | ICD-10-CM

## 2023-08-12 LAB — POCT GLYCOSYLATED HEMOGLOBIN (HGB A1C): HbA1c POC (<> result, manual entry): 6.9 % (ref 4.0–5.6)

## 2023-08-12 NOTE — Patient Instructions (Signed)
Your HgA1c is 6.9 which is great so keep up the good work.

## 2023-08-12 NOTE — Assessment & Plan Note (Signed)
POC HgA1c done and improved and now at goal today 6.9%. He will continue metformin 1000 mg BID. He is on ACE-I and statin which he will continue. Eye exam next month.

## 2023-08-12 NOTE — Progress Notes (Signed)
   Subjective:   Patient ID: Christopher Bradshaw, male    DOB: 08-09-1946, 77 y.o.   MRN: [id number]  HPI The patient is a 77 YO man coming in for follow up diabetes.   Review of Systems  Constitutional: Negative.   HENT: Negative.    Eyes: Negative.   Respiratory:  Negative for cough, chest tightness and shortness of breath.   Cardiovascular:  Negative for chest pain, palpitations and leg swelling.  Gastrointestinal:  Negative for abdominal distention, abdominal pain, constipation, diarrhea, nausea and vomiting.  Musculoskeletal: Negative.   Skin: Negative.   Neurological: Negative.   Psychiatric/Behavioral: Negative.      Objective:  Physical Exam Constitutional:      Appearance: He is well-developed.  HENT:     Head: Normocephalic and atraumatic.  Cardiovascular:     Rate and Rhythm: Normal rate and regular rhythm.  Pulmonary:     Effort: Pulmonary effort is normal. No respiratory distress.     Breath sounds: Normal breath sounds. No wheezing or rales.  Abdominal:     General: Bowel sounds are normal. There is no distension.     Palpations: Abdomen is soft.     Tenderness: There is no abdominal tenderness. There is no rebound.  Musculoskeletal:     Cervical back: Normal range of motion.  Skin:    General: Skin is warm and dry.  Neurological:     Mental Status: He is alert and oriented to person, place, and time.     Coordination: Coordination normal.     Vitals:   08/12/23 1026  BP: 120/80  Pulse: (!) 51  Temp: 98.4 F (36.9 C)  TempSrc: Oral  SpO2: 99%  Weight: 142 lb (64.4 kg)  Height: 5\' 10"  (1.778 m)    Assessment & Plan:  Flu shot given at visit

## 2023-08-16 DIAGNOSIS — Z23 Encounter for immunization: Secondary | ICD-10-CM

## 2023-08-16 DIAGNOSIS — E1151 Type 2 diabetes mellitus with diabetic peripheral angiopathy without gangrene: Secondary | ICD-10-CM | POA: Diagnosis not present

## 2023-08-16 NOTE — Addendum Note (Signed)
Addended by: Levonne Lapping on: 08/16/2023 12:57 PM   Modules accepted: Orders

## 2023-09-12 DIAGNOSIS — E119 Type 2 diabetes mellitus without complications: Secondary | ICD-10-CM | POA: Diagnosis not present

## 2023-09-12 LAB — HM DIABETES EYE EXAM

## 2023-10-03 DIAGNOSIS — N401 Enlarged prostate with lower urinary tract symptoms: Secondary | ICD-10-CM | POA: Diagnosis not present

## 2023-10-03 DIAGNOSIS — N481 Balanitis: Secondary | ICD-10-CM | POA: Diagnosis not present

## 2023-10-03 DIAGNOSIS — N3021 Other chronic cystitis with hematuria: Secondary | ICD-10-CM | POA: Diagnosis not present

## 2023-10-03 DIAGNOSIS — R351 Nocturia: Secondary | ICD-10-CM | POA: Diagnosis not present

## 2023-10-09 DIAGNOSIS — K080 Exfoliation of teeth due to systemic causes: Secondary | ICD-10-CM | POA: Diagnosis not present

## 2024-04-20 ENCOUNTER — Other Ambulatory Visit: Payer: Self-pay | Admitting: Internal Medicine

## 2024-04-20 DIAGNOSIS — E1151 Type 2 diabetes mellitus with diabetic peripheral angiopathy without gangrene: Secondary | ICD-10-CM

## 2024-04-20 NOTE — Telephone Encounter (Signed)
 Copied from CRM [id number]. Topic: Clinical - Medication Refill >> Apr 20, 2024  8:40 AM Carlatta H wrote: Most Recent Primary Care Visit:  Provider: Bambi Lever A  Department: [facility]  Visit Type: OFFICE VISIT  Date: 08/12/2023  Medication: metFORMIN  (GLUCOPHAGE ) 1000 MG tablet [[id number]]  Has the patient contacted their pharmacy? No (Agent: If no, request that the patient contact the pharmacy for the refill. If patient does not wish to contact the pharmacy document the reason why and proceed with request.) (Agent: If yes, when and what did the pharmacy advise?)  Is this the correct pharmacy for this prescription? Yes If no, delete pharmacy and type the correct one.  This is the patient's preferred pharmacy:  [facility] [address] [place] - [address] [place] [address] Phone: [phone] Fax: [phone]   Has the prescription been filled recently? No  Is the patient out of the medication? Yes  Has the patient been seen for an appointment in the last year OR does the patient have an upcoming appointment? Yes  Can we respond through MyChart? Yes  Agent: Please be advised that Rx refills may take up to 3 business days. We ask that you follow-up with your pharmacy.

## 2024-04-22 MED ORDER — METFORMIN HCL 1000 MG PO TABS
1000.0000 mg | ORAL_TABLET | Freq: Two times a day (BID) | ORAL | 1 refills | Status: DC
Start: 2024-04-22 — End: 2024-10-07

## 2024-04-27 ENCOUNTER — Encounter (HOSPITAL_COMMUNITY): Payer: Self-pay

## 2024-05-01 DIAGNOSIS — N5201 Erectile dysfunction due to arterial insufficiency: Secondary | ICD-10-CM | POA: Diagnosis not present

## 2024-05-01 DIAGNOSIS — N481 Balanitis: Secondary | ICD-10-CM | POA: Diagnosis not present

## 2024-05-01 DIAGNOSIS — N401 Enlarged prostate with lower urinary tract symptoms: Secondary | ICD-10-CM | POA: Diagnosis not present

## 2024-05-01 DIAGNOSIS — N3021 Other chronic cystitis with hematuria: Secondary | ICD-10-CM | POA: Diagnosis not present

## 2024-05-01 DIAGNOSIS — R351 Nocturia: Secondary | ICD-10-CM | POA: Diagnosis not present

## 2024-05-04 ENCOUNTER — Ambulatory Visit: Payer: Self-pay | Admitting: Internal Medicine

## 2024-05-04 ENCOUNTER — Ambulatory Visit: Admitting: Internal Medicine

## 2024-05-04 ENCOUNTER — Encounter: Payer: Self-pay | Admitting: Internal Medicine

## 2024-05-04 VITALS — BP 118/68 | HR 52 | Temp 97.7°F | Ht 70.0 in | Wt 143.0 lb

## 2024-05-04 DIAGNOSIS — I10 Essential (primary) hypertension: Secondary | ICD-10-CM

## 2024-05-04 DIAGNOSIS — E785 Hyperlipidemia, unspecified: Secondary | ICD-10-CM

## 2024-05-04 DIAGNOSIS — E1169 Type 2 diabetes mellitus with other specified complication: Secondary | ICD-10-CM | POA: Diagnosis not present

## 2024-05-04 DIAGNOSIS — E1151 Type 2 diabetes mellitus with diabetic peripheral angiopathy without gangrene: Secondary | ICD-10-CM

## 2024-05-04 DIAGNOSIS — Z7984 Long term (current) use of oral hypoglycemic drugs: Secondary | ICD-10-CM

## 2024-05-04 LAB — COMPREHENSIVE METABOLIC PANEL WITH GFR
ALT: 15 U/L (ref 0–53)
AST: 19 U/L (ref 0–37)
Albumin: 4.4 g/dL (ref 3.5–5.2)
Alkaline Phosphatase: 40 U/L (ref 39–117)
BUN: 19 mg/dL (ref 6–23)
CO2: 29 meq/L (ref 19–32)
Calcium: 9.5 mg/dL (ref 8.4–10.5)
Chloride: 104 meq/L (ref 96–112)
Creatinine, Ser: 0.74 mg/dL (ref 0.40–1.50)
GFR: 85.75 mL/min (ref >60.00)
Glucose, Bld: 98 mg/dL (ref 70–99)
Potassium: 4.9 meq/L (ref 3.5–5.1)
Sodium: 139 meq/L (ref 135–145)
Total Bilirubin: 1 mg/dL (ref 0.2–1.2)
Total Protein: 6.6 g/dL (ref 6.0–8.3)

## 2024-05-04 LAB — HEMOGLOBIN A1C: Hgb A1c MFr Bld: 7.5 % — ABNORMAL HIGH (ref 4.6–6.5)

## 2024-05-04 LAB — CBC
HCT: 41.2 % (ref 39.0–52.0)
Hemoglobin: 14 g/dL (ref 13.0–17.0)
MCHC: 33.9 g/dL (ref 30.0–36.0)
MCV: 91.8 fl (ref 78.0–100.0)
Platelets: 116 10*3/uL — ABNORMAL LOW (ref 150.0–400.0)
RBC: 4.49 Mil/uL (ref 4.22–5.81)
RDW: 13.1 % (ref 11.5–15.5)
WBC: 7 10*3/uL (ref 4.0–10.5)

## 2024-05-04 LAB — LIPID PANEL
Cholesterol: 126 mg/dL (ref 0–200)
HDL: 50.7 mg/dL (ref >39.00)
LDL Cholesterol: 61 mg/dL (ref 0–99)
NonHDL: 74.84
Total CHOL/HDL Ratio: 2
Triglycerides: 68 mg/dL (ref 0.0–149.0)
VLDL: 13.6 mg/dL (ref 0.0–40.0)

## 2024-05-04 LAB — MICROALBUMIN / CREATININE URINE RATIO
Creatinine,U: 86.7 mg/dL
Microalb Creat Ratio: UNDETERMINED mg/g (ref 0.0–30.0)
Microalb, Ur: <0.7 mg/dL

## 2024-05-04 MED ORDER — BENAZEPRIL HCL 40 MG PO TABS: 40.0000 mg | ORAL_TABLET | Freq: Every day | ORAL | 3 refills | Status: AC

## 2024-05-04 MED ORDER — SIMVASTATIN 40 MG PO TABS: 40.0000 mg | ORAL_TABLET | Freq: Every day | ORAL | 3 refills | Status: AC

## 2024-05-04 NOTE — Assessment & Plan Note (Signed)
 BP at goal checking CMP and adjust benazepril  as needed.

## 2024-05-04 NOTE — Assessment & Plan Note (Signed)
 Checking HgA1c, foot exam done and UACR and lipid panel and CMP. Adjust metformin  1000 mg BID as needed. On ACE-I and statin.

## 2024-05-04 NOTE — Progress Notes (Signed)
   Subjective:   Patient ID: Christopher Bradshaw, male    DOB: 04/05/1946, 78 y.o.   MRN: [id number]  HPI The patient is an 78 YO man coming in for medical management (see A/P for details).   Review of Systems  Constitutional: Negative.   HENT: Negative.    Eyes: Negative.   Respiratory:  Negative for cough, chest tightness and shortness of breath.   Cardiovascular:  Negative for chest pain, palpitations and leg swelling.  Gastrointestinal:  Negative for abdominal distention, abdominal pain, constipation, diarrhea, nausea and vomiting.  Musculoskeletal: Negative.   Skin: Negative.   Neurological: Negative.   Psychiatric/Behavioral: Negative.      Objective:  Physical Exam Constitutional:      Appearance: He is well-developed.  HENT:     Head: Normocephalic and atraumatic.  Cardiovascular:     Rate and Rhythm: Normal rate and regular rhythm.  Pulmonary:     Effort: Pulmonary effort is normal. No respiratory distress.     Breath sounds: Normal breath sounds. No wheezing or rales.  Abdominal:     General: Bowel sounds are normal. There is no distension.     Palpations: Abdomen is soft.     Tenderness: There is no abdominal tenderness. There is no rebound.  Musculoskeletal:     Cervical back: Normal range of motion.  Skin:    General: Skin is warm and dry.     Comments: Foot exam done  Neurological:     Mental Status: He is alert and oriented to person, place, and time.     Coordination: Coordination normal.     Vitals:   05/04/24 1035  BP: 118/68  Pulse: (!) 52  Temp: 97.7 F (36.5 C)  TempSrc: Oral  SpO2: 98%  Weight: 143 lb (64.9 kg)  Height: 5\' 10"  (1.778 m)    Assessment & Plan:

## 2024-05-04 NOTE — Assessment & Plan Note (Signed)
Checking lipid panel and adjust simvastatin as needed.  

## 2024-05-04 NOTE — Patient Instructions (Signed)
 We will check the labs today.

## 2024-05-20 DIAGNOSIS — K080 Exfoliation of teeth due to systemic causes: Secondary | ICD-10-CM | POA: Diagnosis not present

## 2024-07-10 ENCOUNTER — Telehealth: Payer: Self-pay | Admitting: Pharmacist

## 2024-07-10 NOTE — Progress Notes (Signed)
 Pharmacy Quality Measure Review  This patient is appearing on a report for being at risk of failing the adherence measure for cholesterol (statin) and hypertension (ACEi/ARB) medications this calendar year.   Medication: Benazepril  40 mg Last fill date: 06/03/2024 for 90 day supply  Medication: Simvastatin  40 mg Last fill date: 05/26/2024 for 90 day supply  Insurance report was not up to date. No action needed at this time.    Darrelyn Drum, PharmD, BCPS, CPP Clinical Pharmacist Practitioner [facility] at [facility] [phone]

## 2024-08-22 LAB — MICROALBUMIN / CREATININE URINE RATIO: Microalb Creat Ratio: <30

## 2024-09-29 DIAGNOSIS — B372 Candidiasis of skin and nail: Secondary | ICD-10-CM | POA: Diagnosis not present

## 2024-10-07 ENCOUNTER — Ambulatory Visit: Payer: Self-pay | Admitting: Internal Medicine

## 2024-10-07 ENCOUNTER — Other Ambulatory Visit: Payer: Self-pay | Admitting: Internal Medicine

## 2024-10-07 DIAGNOSIS — E1151 Type 2 diabetes mellitus with diabetic peripheral angiopathy without gangrene: Secondary | ICD-10-CM

## 2024-10-07 NOTE — Telephone Encounter (Signed)
 FYI Only or Action Required?: FYI only for provider.  Patient was last seen in primary care on 05/04/2024 by Rollene Almarie LABOR, MD.  Called Nurse Triage reporting Leg Pain.  Symptoms began several weeks ago.  Interventions attempted: OTC medications: Ibuprofen , Tylenol, lay with legs propped up.  Triage Disposition: See PCP When Office is Open (Within 3 Days)  Patient/caregiver understands and will follow disposition?: Yes              Copied from CRM #[id number]. Topic: Clinical - Red Word Triage >> Oct 07, 2024  1:53 PM Christopher Bradshaw wrote: [facility] that prompted transfer to Nurse Triage: pain  Pt called he wanted to schedule an apt for med called  met formin because he is almost out and he mentioned he wanted lab work done but while Scheduling DT denied  scheduling due to new issue with legs about 3 weeks, he did alot of yard work ans he thinks he pulled  muscle in both legs, pain when he bends over and  squat. Reason for Disposition  [1] MODERATE pain (e.g., interferes with normal activities, limping) AND [2] present > 3 days  Answer Assessment - Initial Assessment Questions This RN scheduled pt for an appointment on Fri, 10/24, in office. This RN educated pt on new-worsening symptoms and when to call back. Pt verbalized understanding and agrees to plan.   Bilateral leg pain x3-4 weeks Pt states he works outside in the yard at his house Denies swelling  PAIN: How bad is the pain?    (Scale 1-10; or mild, moderate, severe)     Intermittent when bending over or squatting  Protocols used: Leg Pain-A-AH

## 2024-10-08 ENCOUNTER — Encounter: Payer: Self-pay | Admitting: Internal Medicine
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
## 2024-10-09 ENCOUNTER — Ambulatory Visit (INDEPENDENT_AMBULATORY_CARE_PROVIDER_SITE_OTHER): Admitting: Family Medicine

## 2024-10-09 ENCOUNTER — Encounter: Payer: Self-pay | Admitting: Family Medicine

## 2024-10-09 VITALS — BP 132/82 | HR 60 | Temp 98.0°F | Ht 70.0 in | Wt 142.6 lb

## 2024-10-09 DIAGNOSIS — M791 Myalgia, unspecified site: Secondary | ICD-10-CM | POA: Diagnosis not present

## 2024-10-09 MED ORDER — TIZANIDINE HCL 2 MG PO TABS: 2.0000 mg | ORAL_TABLET | Freq: Every evening | ORAL | 0 refills | Status: AC | PRN

## 2024-10-09 NOTE — Patient Instructions (Addendum)
 I have attached some hamstring stretches and exercises for you today.   If you are still having pain in about a week, I would like to get you physical therapy for a little more evaluation and treatment.   Aspercreme lidocaine muscle rub to the hamstrings twice a day as needed.   You can just call and let me know in a week if this is not improving, and I will go ahead and send the referral.   May use heat or ice to the area for relief as needed.  May use topical rubs or gels to the area as needed for relief.  Attached subtle stretching exercises to help relieve pain and strengthen muscles.   I have sent in tizanidine 2mg  for you to try once daily at bedtime for muscle pain as needed.   This medication can make you sleepy, so be aware of that when you take it and try it at night first.   Follow-up with me for new or worsening symptoms.

## 2024-12-16 LAB — OPHTHALMOLOGY REPORT-SCANNED

## 2025-01-15 ENCOUNTER — Ambulatory Visit: Payer: Self-pay

## 2025-01-15 NOTE — Telephone Encounter (Signed)
 Spoke with pt and stated that the pain is coming from the hamstring muscle. Pt is having a lot more pain on the Rt from the buttocks to behind the knee. Pt was seen by Corean, FNP on 10/09/24 and was given a list of stretching exercises to do. Pt stated that the Left leg is almost normal however, is still having pain with the right leg.  Pt has also tried otc Aspercream and Biofreeze, which has helped some, along with the stretching exercises as tolerated. This has been ongoing for the last 2-3 months and pt wanted an appt to see Dr. Rollene. Pt has been scheduled for 01/25/25@11am .

## 2025-01-15 NOTE — Telephone Encounter (Signed)
 FYI Only or Action Required?: Action required by provider: request for appointment and clinical question for provider.  Patient was last seen in primary care on 10/09/2024 by Christopher Corean CROME, FNP.  Called Nurse Triage reporting Leg Pain.  Symptoms began a week ago.  Interventions attempted: OTC medications: lidocaine, Biofreeze, heating pad, tylenol, ibuprofen .  Symptoms are: unchanged.  Triage Disposition: See HCP Within 4 Hours (Or PCP Triage)  Patient/caregiver understands and will follow disposition?: No, wishes to speak with PCP   Message from Christopher Bradshaw sent at 01/15/2025 12:32 PM EST  Reason for Triage: He has pain in his right leg. When he gets up and down, he has sharp pain. This has been going for 2 months and getting worse. Pain level up to 9.     Reason for Disposition  [1] SEVERE pain (e.g., excruciating, unable to do any normal activities) AND [2] not improved after 2 hours of pain medicine  Answer Assessment - Initial Assessment Questions Offered appt, patient declines and reports unable to make appts due to weather, stays 40 minutes from clinic.  Patient requesting appt next week and call back.  Advised call back/UC today or ED/911 if symptoms occur/worsen: severe diff breathing, chest pain > 5 min, faint. Severe pain constant, numbness/weakness, loss bowel/bladder control, cool/blue extremities, redness, hot to touch, swelling. Patient verbalized understanding.    1. LOCATION and RADIATION: Where is the pain located? Does the pain spread (shoot) anywhere else?     Right Hip to knee 2. QUALITY: What does the pain feel like?  (e.g., sharp, dull, aching, burning)     Feels like muscle strinking Still hydrated, still urinating as normal, clear urine 3. SEVERITY: How bad is the pain? What does it keep you from doing?   (Scale 1-10; or mild, moderate, severe)     No pain currently, movement 9/10 4. ONSET: When did the pain start? Does it come and  go, or is it there all the time?     3 weeks ago 6. CAUSE: What do you think is causing the hip pain?      unsure 7. AGGRAVATING FACTORS: What makes the hip pain worse? (e.g., walking, climbing stairs, running)     Standing, bending over 8. OTHER SYMPTOMS: Do you have any other symptoms? (e.g., back pain, pain shooting down leg,  fever, rash)     Denies diff breathing, chest pain, back pain, abd pain, rash, fever, chills, n/v  Protocols used: Hip Pain-A-AH

## 2025-01-25 ENCOUNTER — Ambulatory Visit: Admitting: Internal Medicine
